# Patient Record
Sex: Female | Born: 1955 | Race: White | Hispanic: No | Marital: Married | Smoking: Never smoker
Health system: Southern US, Community
[De-identification: ages and names within clinical notes are randomized; demographics above are authoritative.]

## PROBLEM LIST (undated history)

## (undated) DIAGNOSIS — I10 Essential (primary) hypertension: Secondary | ICD-10-CM

## (undated) DIAGNOSIS — C50919 Malignant neoplasm of unspecified site of unspecified female breast: Secondary | ICD-10-CM

## (undated) DIAGNOSIS — K219 Gastro-esophageal reflux disease without esophagitis: Secondary | ICD-10-CM

## (undated) DIAGNOSIS — R7303 Prediabetes: Secondary | ICD-10-CM

## (undated) HISTORY — PX: ABDOMINAL HYSTERECTOMY: SHX81

## (undated) HISTORY — DX: Malignant neoplasm of unspecified site of unspecified female breast: C50.919

---

## 2004-10-17 ENCOUNTER — Ambulatory Visit: Payer: Self-pay | Admitting: Obstetrics and Gynecology

## 2004-11-17 ENCOUNTER — Ambulatory Visit: Payer: Self-pay | Admitting: Obstetrics and Gynecology

## 2005-12-01 ENCOUNTER — Ambulatory Visit: Payer: Self-pay | Admitting: Obstetrics and Gynecology

## 2006-12-03 ENCOUNTER — Ambulatory Visit: Payer: Self-pay | Admitting: Obstetrics and Gynecology

## 2007-10-27 ENCOUNTER — Ambulatory Visit: Payer: Self-pay | Admitting: Emergency Medicine

## 2007-12-10 ENCOUNTER — Ambulatory Visit: Payer: Self-pay | Admitting: Obstetrics and Gynecology

## 2008-01-17 ENCOUNTER — Ambulatory Visit: Payer: Self-pay | Admitting: Gastroenterology

## 2008-12-10 ENCOUNTER — Ambulatory Visit: Payer: Self-pay | Admitting: Obstetrics and Gynecology

## 2010-03-31 ENCOUNTER — Ambulatory Visit: Payer: Self-pay

## 2011-02-19 ENCOUNTER — Emergency Department: Payer: Self-pay | Admitting: Emergency Medicine

## 2011-04-04 ENCOUNTER — Ambulatory Visit: Payer: Self-pay

## 2011-05-09 ENCOUNTER — Ambulatory Visit: Payer: Self-pay

## 2011-06-20 ENCOUNTER — Ambulatory Visit: Payer: Self-pay | Admitting: Internal Medicine

## 2012-04-11 ENCOUNTER — Ambulatory Visit: Payer: Self-pay

## 2012-06-07 ENCOUNTER — Ambulatory Visit: Payer: Self-pay | Admitting: Gastroenterology

## 2012-06-21 ENCOUNTER — Ambulatory Visit: Payer: Self-pay | Admitting: Gastroenterology

## 2012-06-25 LAB — PATHOLOGY REPORT

## 2012-06-30 ENCOUNTER — Ambulatory Visit: Payer: Self-pay | Admitting: Gastroenterology

## 2013-04-17 ENCOUNTER — Ambulatory Visit: Payer: Self-pay

## 2013-04-18 ENCOUNTER — Ambulatory Visit: Payer: Self-pay

## 2013-05-05 ENCOUNTER — Other Ambulatory Visit: Payer: Self-pay | Admitting: Surgery

## 2013-05-05 DIAGNOSIS — N63 Unspecified lump in unspecified breast: Secondary | ICD-10-CM

## 2013-05-13 ENCOUNTER — Other Ambulatory Visit: Payer: Self-pay

## 2013-05-15 ENCOUNTER — Ambulatory Visit
Admission: RE | Admit: 2013-05-15 | Discharge: 2013-05-15 | Disposition: A | Discharge status: Home / Self Care | Payer: 59 | Source: Ambulatory Visit | Attending: Surgery | Admitting: Surgery

## 2013-05-15 DIAGNOSIS — N63 Unspecified lump in unspecified breast: Secondary | ICD-10-CM

## 2013-05-15 MED ORDER — GADOBENATE DIMEGLUMINE 529 MG/ML IV SOLN
20.0000 mL | Freq: Once | INTRAVENOUS | Status: AC | PRN
  Administered 2013-05-15: 20 mL via INTRAVENOUS

## 2013-11-29 ENCOUNTER — Ambulatory Visit: Payer: Self-pay | Admitting: Emergency Medicine

## 2014-04-23 ENCOUNTER — Ambulatory Visit: Payer: Self-pay

## 2014-08-11 DIAGNOSIS — E782 Mixed hyperlipidemia: Secondary | ICD-10-CM | POA: Insufficient documentation

## 2014-11-29 ENCOUNTER — Ambulatory Visit: Payer: Self-pay | Admitting: Family Medicine

## 2015-01-28 ENCOUNTER — Emergency Department
Admit: 2015-01-28 | Disposition: A | Discharge status: Home / Self Care | Payer: Self-pay | Admitting: Emergency Medicine

## 2015-01-28 LAB — CBC
HCT: 43 % (ref 35.0–47.0)
HGB: 13.8 g/dL (ref 12.0–16.0)
MCH: 25.8 pg — ABNORMAL LOW (ref 26.0–34.0)
MCHC: 32.1 g/dL (ref 32.0–36.0)
MCV: 80 fL (ref 80–100)
PLATELETS: 269 10*3/uL (ref 150–440)
RBC: 5.35 10*6/uL — AB (ref 3.80–5.20)
RDW: 14.9 % — ABNORMAL HIGH (ref 11.5–14.5)
WBC: 7.7 10*3/uL (ref 3.6–11.0)

## 2015-01-28 LAB — BASIC METABOLIC PANEL
Anion Gap: 7 (ref 7–16)
BUN: 11 mg/dL
CHLORIDE: 102 mmol/L
CREATININE: 0.82 mg/dL
Calcium, Total: 9.1 mg/dL
Co2: 26 mmol/L
EGFR (African American): >60
Glucose: 104 mg/dL — ABNORMAL HIGH
POTASSIUM: 3.3 mmol/L — AB
Sodium: 135 mmol/L

## 2015-01-28 LAB — TROPONIN I
Troponin-I: <0.03 ng/mL
Troponin-I: <0.03 ng/mL

## 2015-09-02 ENCOUNTER — Ambulatory Visit
Admission: RE | Admit: 2015-09-02 | Discharge: 2015-09-02 | Disposition: A | Discharge status: Home / Self Care | Payer: 59 | Source: Ambulatory Visit | Attending: Internal Medicine | Admitting: Internal Medicine

## 2015-09-02 ENCOUNTER — Other Ambulatory Visit: Payer: Self-pay | Admitting: Internal Medicine

## 2015-09-02 DIAGNOSIS — Z1231 Encounter for screening mammogram for malignant neoplasm of breast: Secondary | ICD-10-CM | POA: Insufficient documentation

## 2016-02-10 DIAGNOSIS — I10 Essential (primary) hypertension: Secondary | ICD-10-CM | POA: Insufficient documentation

## 2016-04-06 ENCOUNTER — Other Ambulatory Visit: Payer: Self-pay | Admitting: Obstetrics and Gynecology

## 2016-04-06 DIAGNOSIS — Z1231 Encounter for screening mammogram for malignant neoplasm of breast: Secondary | ICD-10-CM

## 2016-09-05 ENCOUNTER — Ambulatory Visit
Admission: RE | Admit: 2016-09-05 | Discharge: 2016-09-05 | Disposition: A | Discharge status: Home / Self Care | Payer: 59 | Source: Ambulatory Visit | Attending: Obstetrics and Gynecology | Admitting: Obstetrics and Gynecology

## 2016-09-05 DIAGNOSIS — R7989 Other specified abnormal findings of blood chemistry: Secondary | ICD-10-CM | POA: Insufficient documentation

## 2016-09-05 DIAGNOSIS — R0789 Other chest pain: Secondary | ICD-10-CM | POA: Insufficient documentation

## 2016-09-05 DIAGNOSIS — Z1231 Encounter for screening mammogram for malignant neoplasm of breast: Secondary | ICD-10-CM | POA: Insufficient documentation

## 2016-09-20 ENCOUNTER — Ambulatory Visit
Admission: EM | Admit: 2016-09-20 | Discharge: 2016-09-20 | Disposition: A | Discharge status: Home / Self Care | Payer: 59 | Attending: Emergency Medicine | Admitting: Emergency Medicine

## 2016-09-20 ENCOUNTER — Encounter: Payer: Self-pay | Admitting: *Deleted

## 2016-09-20 DIAGNOSIS — M5431 Sciatica, right side: Secondary | ICD-10-CM | POA: Diagnosis not present

## 2016-09-20 HISTORY — DX: Gastro-esophageal reflux disease without esophagitis: K21.9

## 2016-09-20 HISTORY — DX: Essential (primary) hypertension: I10

## 2016-09-20 MED ORDER — PREDNISONE 10 MG PO TABS: 20.0000 mg | ORAL_TABLET | Freq: Every day | ORAL | 0 refills | Status: DC

## 2016-09-20 MED ORDER — HYDROCODONE-ACETAMINOPHEN 5-325 MG PO TABS: 1.0000 | ORAL_TABLET | Freq: Four times a day (QID) | ORAL | 0 refills | Status: DC | PRN

## 2016-09-20 NOTE — ED Provider Notes (Signed)
CSN: [id number]     Arrival date & time 09/20/16  1038 History   First MD Initiated Contact with Patient 09/20/16 1245     Chief Complaint  Patient presents with  . Back Pain   (Consider location/radiation/quality/duration/timing/severity/associated sxs/prior Treatment) [name] is a well-appearing 60 y.o female, with history of HTN and GERD, presents today for acute right lower back pain onset yesterday. Patient denies injury and cannot think of anything that would trigger the pain. Patient reports that if she moves a certain way, the pain feels like "a knife is going through my back". Patient states that it "hurts my back if my take  deep breaths". She also reports trying heat patches and ibuprofen with no relief. Patient reports the pain is stabbing, shootting and sharp. Her pain is average about 3/10 if she sits still but would be over 10/10 if she moves. She denies the pain radiating down her hip. She denies previous hx of back pain. She denies history of DDD of her back or bulging/herniated disc, however patient states that she had a bone scan done 7 years ago and it showed that she may have a mild degenerative changes in her back.       Past Medical History:  Diagnosis Date  . GERD (gastroesophageal reflux disease)   . Hypertension    Past Surgical History:  Procedure Laterality Date  . ABDOMINAL HYSTERECTOMY     Family History  Problem Relation Age of Onset  . Breast cancer Neg Hx    Social History  Substance Use Topics  . Smoking status: Never Smoker  . Smokeless tobacco: Never Used  . Alcohol use Yes   OB History    No data available     Review of Systems  All other systems reviewed and are negative.   Allergies  Patient has no known allergies.  Home Medications   Prior to Admission medications   Medication Sig Start Date End Date Taking? Authorizing Provider  aspirin EC 81 MG tablet Take 81 mg by mouth daily.   Yes Historical Provider, MD  carvedilol  (COREG) 25 MG tablet Take 25 mg by mouth 2 (two) times daily with a meal.   Yes Historical Provider, MD  hydrochlorothiazide (HYDRODIURIL) 25 MG tablet Take 25 mg by mouth daily.   Yes Historical Provider, MD  omeprazole (PRILOSEC) 20 MG capsule Take 20 mg by mouth 2 (two) times daily before a meal.   Yes Historical Provider, MD  HYDROcodone-acetaminophen (NORCO/VICODIN) 5-325 MG tablet Take 1 tablet by mouth every 6 (six) hours as needed. 09/20/16   Barry Dienes, NP  predniSONE (DELTASONE) 10 MG tablet Take 2 tablets (20 mg total) by mouth daily. Take 3 tablets on day 1-2, Take 2 tablets on day 3-4, Take 1 tablet on day 5-6 09/20/16   Barry Dienes, NP   Meds Ordered and Administered this Visit  Medications - No data to display  BP (!) 129/57 (BP Location: Left Arm)   Pulse 67   Temp 98.1 F (36.7 C) (Oral)   Resp 16   Ht 5\' 6"  (1.676 m)   Wt 218 lb (98.9 kg)   SpO2 97%   BMI 35.19 kg/m  No data found.   Physical Exam  Constitutional: She is oriented to person, place, and time. She appears well-developed and well-nourished.  HENT:  Head: Normocephalic and atraumatic.  Cardiovascular: Normal rate, regular rhythm and normal heart sounds.   Pulmonary/Chest: Effort normal and breath sounds normal. No respiratory distress.  She has no wheezes.  Musculoskeletal:  Patient has difficulty getting up from the chair and up the exam table. She reports pain during ambulation. She has +straight leg raise. R hip has full ROM. Right lower back region is slightly sore on palpation.   Neurological: She is alert and oriented to person, place, and time.  Nursing note and vitals reviewed.   Urgent Care Course   Clinical Course     Procedures (including critical care time)  Labs Review Labs Reviewed - No data to display  Imaging Review No results found.  MDM   1. Sciatica of right side    Will tx with Prednisone taper and hydrocodone-apap for pain. [place] controlled substances reviewed and is  appropriate. Reviewed directions for usage and side effects. Patient states understanding and will call with questions or problems. Patient instructed to call or follow up with his/her primary care doctor if failure to improve or change in symptoms. Discharge instruction given.     Barry Dienes, NP 09/20/16 1317

## 2016-09-20 NOTE — ED Triage Notes (Signed)
Patient started having symptoms of lower back pain yesterday. Patient does have a history of lower back pain.

## 2017-03-15 DIAGNOSIS — L821 Other seborrheic keratosis: Secondary | ICD-10-CM | POA: Diagnosis not present

## 2017-03-15 DIAGNOSIS — Z86018 Personal history of other benign neoplasm: Secondary | ICD-10-CM | POA: Diagnosis not present

## 2017-03-15 DIAGNOSIS — L738 Other specified follicular disorders: Secondary | ICD-10-CM | POA: Diagnosis not present

## 2017-05-17 ENCOUNTER — Other Ambulatory Visit: Payer: Self-pay | Admitting: Obstetrics and Gynecology

## 2017-05-17 DIAGNOSIS — Z1231 Encounter for screening mammogram for malignant neoplasm of breast: Secondary | ICD-10-CM

## 2017-05-17 DIAGNOSIS — Z01419 Encounter for gynecological examination (general) (routine) without abnormal findings: Secondary | ICD-10-CM | POA: Diagnosis not present

## 2017-05-17 DIAGNOSIS — Z1211 Encounter for screening for malignant neoplasm of colon: Secondary | ICD-10-CM | POA: Diagnosis not present

## 2017-08-13 DIAGNOSIS — Z86010 Personal history of colonic polyps: Secondary | ICD-10-CM | POA: Diagnosis not present

## 2017-09-04 DIAGNOSIS — Z0000 Encounter for general adult medical examination without abnormal findings: Secondary | ICD-10-CM | POA: Diagnosis not present

## 2017-09-10 ENCOUNTER — Ambulatory Visit
Admission: RE | Admit: 2017-09-10 | Discharge: 2017-09-10 | Disposition: A | Discharge status: Home / Self Care | Payer: 59 | Source: Ambulatory Visit | Attending: Obstetrics and Gynecology | Admitting: Obstetrics and Gynecology

## 2017-09-10 DIAGNOSIS — Z1231 Encounter for screening mammogram for malignant neoplasm of breast: Secondary | ICD-10-CM | POA: Diagnosis not present

## 2017-09-11 DIAGNOSIS — M5010 Cervical disc disorder with radiculopathy, unspecified cervical region: Secondary | ICD-10-CM | POA: Diagnosis not present

## 2017-09-11 DIAGNOSIS — Z0000 Encounter for general adult medical examination without abnormal findings: Secondary | ICD-10-CM | POA: Diagnosis not present

## 2017-09-11 DIAGNOSIS — Z789 Other specified health status: Secondary | ICD-10-CM | POA: Insufficient documentation

## 2017-10-03 DIAGNOSIS — M5010 Cervical disc disorder with radiculopathy, unspecified cervical region: Secondary | ICD-10-CM | POA: Diagnosis not present

## 2017-12-12 ENCOUNTER — Encounter: Payer: Self-pay | Admitting: *Deleted

## 2017-12-13 ENCOUNTER — Ambulatory Visit: Payer: 59 | Admitting: Certified Registered Nurse Anesthetist

## 2017-12-13 ENCOUNTER — Ambulatory Visit
Admission: RE | Admit: 2017-12-13 | Discharge: 2017-12-13 | Disposition: A | Discharge status: Home / Self Care | Payer: 59 | Source: Ambulatory Visit | Attending: Gastroenterology | Admitting: Gastroenterology

## 2017-12-13 ENCOUNTER — Encounter: Payer: Self-pay | Admitting: *Deleted

## 2017-12-13 ENCOUNTER — Encounter
Admission: RE | Disposition: A | Discharge status: Home / Self Care | Payer: Self-pay | Source: Ambulatory Visit | Attending: Gastroenterology

## 2017-12-13 DIAGNOSIS — Z7982 Long term (current) use of aspirin: Secondary | ICD-10-CM | POA: Insufficient documentation

## 2017-12-13 DIAGNOSIS — K635 Polyp of colon: Secondary | ICD-10-CM | POA: Diagnosis not present

## 2017-12-13 DIAGNOSIS — K219 Gastro-esophageal reflux disease without esophagitis: Secondary | ICD-10-CM | POA: Insufficient documentation

## 2017-12-13 DIAGNOSIS — I10 Essential (primary) hypertension: Secondary | ICD-10-CM | POA: Insufficient documentation

## 2017-12-13 DIAGNOSIS — Z86010 Personal history of colonic polyps: Secondary | ICD-10-CM | POA: Diagnosis not present

## 2017-12-13 DIAGNOSIS — Z1211 Encounter for screening for malignant neoplasm of colon: Secondary | ICD-10-CM | POA: Insufficient documentation

## 2017-12-13 DIAGNOSIS — D120 Benign neoplasm of cecum: Secondary | ICD-10-CM | POA: Insufficient documentation

## 2017-12-13 DIAGNOSIS — K5730 Diverticulosis of large intestine without perforation or abscess without bleeding: Secondary | ICD-10-CM | POA: Insufficient documentation

## 2017-12-13 DIAGNOSIS — Z79899 Other long term (current) drug therapy: Secondary | ICD-10-CM | POA: Diagnosis not present

## 2017-12-13 DIAGNOSIS — D124 Benign neoplasm of descending colon: Secondary | ICD-10-CM | POA: Diagnosis not present

## 2017-12-13 DIAGNOSIS — K6389 Other specified diseases of intestine: Secondary | ICD-10-CM | POA: Diagnosis not present

## 2017-12-13 DIAGNOSIS — D123 Benign neoplasm of transverse colon: Secondary | ICD-10-CM | POA: Diagnosis not present

## 2017-12-13 DIAGNOSIS — K621 Rectal polyp: Secondary | ICD-10-CM | POA: Insufficient documentation

## 2017-12-13 HISTORY — PX: COLONOSCOPY WITH PROPOFOL: SHX5780

## 2017-12-13 SURGERY — COLONOSCOPY WITH PROPOFOL
Anesthesia: General

## 2017-12-13 MED ORDER — PROPOFOL 500 MG/50ML IV EMUL
INTRAVENOUS | Status: AC
  Filled 2017-12-13: qty 50

## 2017-12-13 MED ORDER — SODIUM CHLORIDE 0.9 % IV SOLN
INTRAVENOUS | Status: DC
  Administered 2017-12-13: 12:00:00 via INTRAVENOUS
  Administered 2017-12-13: 1000 mL via INTRAVENOUS

## 2017-12-13 MED ORDER — PROPOFOL 500 MG/50ML IV EMUL
INTRAVENOUS | Status: DC | PRN
  Administered 2017-12-13: 140 ug/kg/min via INTRAVENOUS

## 2017-12-13 MED ORDER — PROPOFOL 10 MG/ML IV BOLUS
INTRAVENOUS | Status: AC
  Filled 2017-12-13: qty 20

## 2017-12-13 MED ORDER — PROPOFOL 10 MG/ML IV BOLUS
INTRAVENOUS | Status: DC | PRN
  Administered 2017-12-13: 60 mg via INTRAVENOUS

## 2017-12-13 MED ORDER — SODIUM CHLORIDE 0.9 % IV SOLN: INTRAVENOUS | Status: DC

## 2017-12-13 NOTE — H&P (Signed)
Outpatient short stay form Pre-procedure 12/13/2017 11:02 AM Lollie Sails MD  Primary Physician: Dr. Emily Filbert  Reason for visit: Colonoscopy  History of present illness: Patient is a 62 year old female presenting today as above.  She has personal history of adenomatous colon polyps with her last colonoscopy being done 06/21/2012.  She tolerated her prep well.  She takes no blood thinners or aspirin products with the exception of 81 mg aspirin.    Current Facility-Administered Medications:  .  0.9 %  sodium chloride infusion, , Intravenous, Continuous, Lollie Sails, MD, Last Rate: 700 mL/hr at 12/13/17 1043 .  0.9 %  sodium chloride infusion, , Intravenous, Continuous, Lollie Sails, MD  Medications Prior to Admission  Medication Sig Dispense Refill Last Dose  . aspirin EC 81 MG tablet Take 81 mg by mouth daily.   Past Week at Unknown time  . carvedilol (COREG) 25 MG tablet Take 25 mg by mouth 2 (two) times daily with a meal.   12/13/2017 at Unknown time  . hydrochlorothiazide (HYDRODIURIL) 25 MG tablet Take 25 mg by mouth daily.   12/13/2017 at Unknown time  . HYDROcodone-acetaminophen (NORCO/VICODIN) 5-325 MG tablet Take 1 tablet by mouth every 6 (six) hours as needed. 12 tablet 0 Past Week at Unknown time  . omeprazole (PRILOSEC) 20 MG capsule Take 20 mg by mouth 2 (two) times daily before a meal.   12/13/2017 at Unknown time  . predniSONE (DELTASONE) 10 MG tablet Take 2 tablets (20 mg total) by mouth daily. Take 3 tablets on day 1-2, Take 2 tablets on day 3-4, Take 1 tablet on day 5-6 (Patient not taking: Reported on 12/13/2017) 12 tablet 0 Completed Course at Unknown time     Not on File   Past Medical History:  Diagnosis Date  . GERD (gastroesophageal reflux disease)   . Hypertension     Review of systems:      Physical Exam    Heart and lungs: Regular rate and rhythm without rub or gallop, lungs are bilaterally clear.    HEENT: Normocephalic atraumatic  eyes are anicteric    Other:    Pertinant exam for procedure: Soft nontender nondistended bowel sounds positive normoactive    Planned proceedures: Colonoscopy and indicated procedures. I have discussed the risks benefits and complications of procedures to include not limited to bleeding, infection, perforation and the risk of sedation and the patient wishes to proceed.    Lollie Sails, MD Gastroenterology 12/13/2017  11:02 AM

## 2017-12-13 NOTE — Anesthesia Post-op Follow-up Note (Signed)
Anesthesia QCDR form completed.        

## 2017-12-13 NOTE — Op Note (Signed)
[facility] Gastroenterology Patient Name: Kristine Rose Procedure Date: 12/13/2017 10:23 AM MRN: [id number] Account #: [id number] Date of Birth: January 18, 1956 Admit Type: Outpatient Age: 62 Room: [facility] ENDO ROOM 1 Gender: Female Note Status: Finalized Procedure:            Colonoscopy Indications:          Personal history of colonic polyps Providers:            Lollie Sails, MD Referring MD:         Rusty Aus, MD (Referring MD) Medicines:            Monitored Anesthesia Care Complications:        No immediate complications. Procedure:            Pre-Anesthesia Assessment:                       - ASA Grade Assessment: II - A patient with mild                        systemic disease.                       After obtaining informed consent, the colonoscope was                        passed under direct vision. Throughout the procedure,                        the patient's blood pressure, pulse, and oxygen                        saturations were monitored continuously. The                        Colonoscope was introduced through the anus and                        advanced to the the cecum, identified by appendiceal                        orifice and ileocecal valve. The quality of the bowel                        preparation was good. Findings:      Multiple small-mouthed diverticula were found in the sigmoid colon and       descending colon.      A 14 mm polyp was found in the cecum. The polyp was sessile. The polyp       was removed with a cold snare. Resection and retrieval were complete.      A 3 mm polyp was found in the splenic flexure. The polyp was sessile.       The polyp was removed with a cold biopsy forceps. Resection and       retrieval were complete.      A 3 mm polyp was found in the descending colon. The polyp was sessile.       The polyp was removed with a cold biopsy forceps. Resection and       retrieval were complete.      A 3 mm  polyp was found in the sigmoid colon. The polyp  was sessile. The       polyp was removed with a cold biopsy forceps. Resection and retrieval       were complete.      Four sessile polyps were found in the rectum. The polyps were 1 to 2 mm       in size. These polyps were removed with a cold biopsy forceps. Resection       and retrieval were complete.      The digital rectal exam was normal. Impression:           - Diverticulosis in the sigmoid colon and in the                        descending colon.                       - One 14 mm polyp in the cecum, removed with a cold                        snare. Resected and retrieved.                       - One 3 mm polyp at the splenic flexure, removed with a                        cold biopsy forceps. Resected and retrieved.                       - One 3 mm polyp in the descending colon, removed with                        a cold biopsy forceps. Resected and retrieved.                       - One 3 mm polyp in the sigmoid colon, removed with a                        cold biopsy forceps. Resected and retrieved.                       - Four 1 to 2 mm polyps in the rectum, removed with a                        cold biopsy forceps. Resected and retrieved. Recommendation:       - Full liquid diet today, then advance as tolerated to                        soft diet for 2 days. Procedure Code(s):    --- Professional ---                       [phone], Colonoscopy, flexible; with removal of tumor(s),                        polyp(s), or other lesion(s) by snare technique                       86761, 59, Colonoscopy, flexible; with biopsy, single  or multiple Diagnosis Code(s):    --- Professional ---                       D12.0, Benign neoplasm of cecum                       D12.3, Benign neoplasm of transverse colon (hepatic                        flexure or splenic flexure)                       D12.4, Benign neoplasm of  descending colon                       D12.5, Benign neoplasm of sigmoid colon                       K62.1, Rectal polyp                       Z86.010, Personal history of colonic polyps                       K57.30, Diverticulosis of large intestine without                        perforation or abscess without bleeding CPT copyright 2016 American Medical Association. All rights reserved. The codes documented in this report are preliminary and upon coder review may  be revised to meet current compliance requirements. Lollie Sails, MD 12/13/2017 12:09:00 PM This report has been signed electronically. Number of Addenda: 0 Note Initiated On: 12/13/2017 10:23 AM Scope Withdrawal Time: 0 hours 31 minutes 50 seconds  Total Procedure Duration: 0 hours 56 minutes 35 seconds       [facility]

## 2017-12-13 NOTE — Transfer of Care (Signed)
Immediate Anesthesia Transfer of Care Note  Patient: Kristine Rose  Procedure(s) Performed: COLONOSCOPY WITH PROPOFOL (N/A )  Patient Location: PACU  Anesthesia Type:General  Level of Consciousness: sedated  Airway & Oxygen Therapy: Patient Spontanous Breathing and Patient connected to nasal cannula oxygen  Post-op Assessment: Report given to RN and Post -op Vital signs reviewed and stable  Post vital signs: Reviewed and stable  Last Vitals:  Vitals:   12/13/17 0918 12/13/17 1213  BP: 124/64 109/63  Pulse: 66   Resp: 20 16  Temp: (!) 36.1 C (!) 36.4 C  SpO2: 98%     Last Pain:  Vitals:   12/13/17 1213  TempSrc: Tympanic      Patients Stated Pain Goal: 0 (58/83/25 4982)  Complications: No apparent anesthesia complications

## 2017-12-13 NOTE — Anesthesia Preprocedure Evaluation (Signed)
Anesthesia Evaluation  Patient identified by MRN, date of birth, ID band Patient awake    Reviewed: Allergy & Precautions, H&P , NPO status , reviewed documented beta blocker date and time   Airway Mallampati: II  TM Distance: >3 FB     Dental  (+) Teeth Intact   Pulmonary    Pulmonary exam normal        Cardiovascular hypertension, On Medications Normal cardiovascular exam     Neuro/Psych    GI/Hepatic GERD  Controlled,  Endo/Other    Renal/GU      Musculoskeletal   Abdominal   Peds  Hematology   Anesthesia Other Findings   Reproductive/Obstetrics                             Anesthesia Physical Anesthesia Plan  ASA: II  Anesthesia Plan: General   Post-op Pain Management:    Induction:   PONV Risk Score and Plan: 3 and Propofol infusion  Airway Management Planned:   Additional Equipment:   Intra-op Plan:   Post-operative Plan:   Informed Consent: I have reviewed the patients History and Physical, chart, labs and discussed the procedure including the risks, benefits and alternatives for the proposed anesthesia with the patient or authorized representative who has indicated his/her understanding and acceptance.   Dental Advisory Given  Plan Discussed with: CRNA  Anesthesia Plan Comments:         Anesthesia Quick Evaluation

## 2017-12-13 NOTE — Anesthesia Procedure Notes (Signed)
Performed by: Amarii Bordas, CRNA Pre-anesthesia Checklist: Patient identified, Emergency Drugs available, Suction available, Patient being monitored and Timeout performed Oxygen Delivery Method: Nasal cannula Induction Type: IV induction       

## 2017-12-13 NOTE — Anesthesia Postprocedure Evaluation (Signed)
Anesthesia Post Note  Patient: Kristine Rose  Procedure(s) Performed: COLONOSCOPY WITH PROPOFOL (N/A )  Patient location during evaluation: Endoscopy Anesthesia Type: General Level of consciousness: awake and alert Pain management: pain level controlled Vital Signs Assessment: post-procedure vital signs reviewed and stable Respiratory status: spontaneous breathing, nonlabored ventilation, respiratory function stable and patient connected to nasal cannula oxygen Cardiovascular status: blood pressure returned to baseline and stable Postop Assessment: no apparent nausea or vomiting Anesthetic complications: no     Last Vitals:  Vitals:   12/13/17 0918 12/13/17 1213  BP: 124/64 109/63  Pulse: 66   Resp: 20 16  Temp: (!) 36.1 C (!) 36.4 C  SpO2: 98%     Last Pain:  Vitals:   12/13/17 1213  TempSrc: Tympanic                 Feliz Herard Harvie Heck

## 2017-12-18 LAB — SURGICAL PATHOLOGY

## 2018-05-23 DIAGNOSIS — Z1231 Encounter for screening mammogram for malignant neoplasm of breast: Secondary | ICD-10-CM | POA: Diagnosis not present

## 2018-05-23 DIAGNOSIS — Z01419 Encounter for gynecological examination (general) (routine) without abnormal findings: Secondary | ICD-10-CM | POA: Diagnosis not present

## 2018-07-22 ENCOUNTER — Other Ambulatory Visit: Payer: Self-pay | Admitting: Obstetrics and Gynecology

## 2018-07-22 DIAGNOSIS — Z1231 Encounter for screening mammogram for malignant neoplasm of breast: Secondary | ICD-10-CM

## 2018-09-05 DIAGNOSIS — E538 Deficiency of other specified B group vitamins: Secondary | ICD-10-CM | POA: Diagnosis not present

## 2018-09-05 DIAGNOSIS — M5010 Cervical disc disorder with radiculopathy, unspecified cervical region: Secondary | ICD-10-CM | POA: Diagnosis not present

## 2018-09-05 DIAGNOSIS — Z0000 Encounter for general adult medical examination without abnormal findings: Secondary | ICD-10-CM | POA: Diagnosis not present

## 2018-09-12 DIAGNOSIS — Z0000 Encounter for general adult medical examination without abnormal findings: Secondary | ICD-10-CM | POA: Diagnosis not present

## 2018-09-12 DIAGNOSIS — G5701 Lesion of sciatic nerve, right lower limb: Secondary | ICD-10-CM | POA: Diagnosis not present

## 2018-09-16 ENCOUNTER — Ambulatory Visit
Admission: RE | Admit: 2018-09-16 | Discharge: 2018-09-16 | Disposition: A | Discharge status: Home / Self Care | Payer: 59 | Source: Ambulatory Visit | Attending: Obstetrics and Gynecology | Admitting: Obstetrics and Gynecology

## 2018-09-16 DIAGNOSIS — Z1231 Encounter for screening mammogram for malignant neoplasm of breast: Secondary | ICD-10-CM | POA: Diagnosis present

## 2018-12-16 DIAGNOSIS — J40 Bronchitis, not specified as acute or chronic: Secondary | ICD-10-CM | POA: Diagnosis not present

## 2018-12-16 DIAGNOSIS — J45902 Unspecified asthma with status asthmaticus: Secondary | ICD-10-CM | POA: Diagnosis not present

## 2019-06-14 IMAGING — MG 2D DIGITAL SCREENING BILATERAL MAMMOGRAM WITH CAD AND ADJUNCT TO
8 of 12 series · 8 of 28 positions shown · non-contrast
Comparison: Previous exam(s).

CLINICAL DATA: Screening.

EXAM:
2D DIGITAL SCREENING BILATERAL MAMMOGRAM WITH CAD AND ADJUNCT TOMO

[L MLO synth-2D]
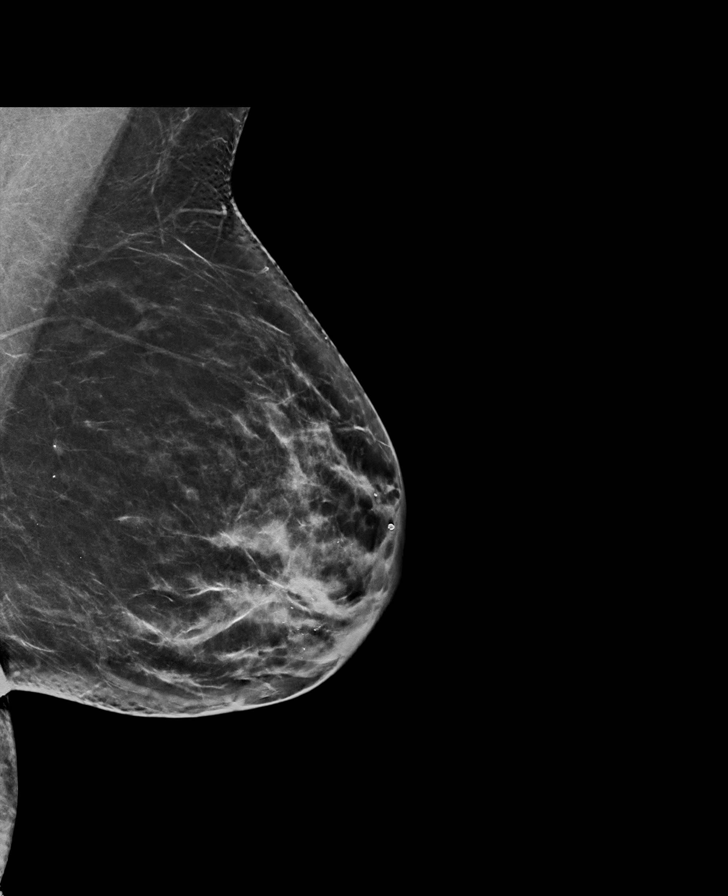

[L MLO]
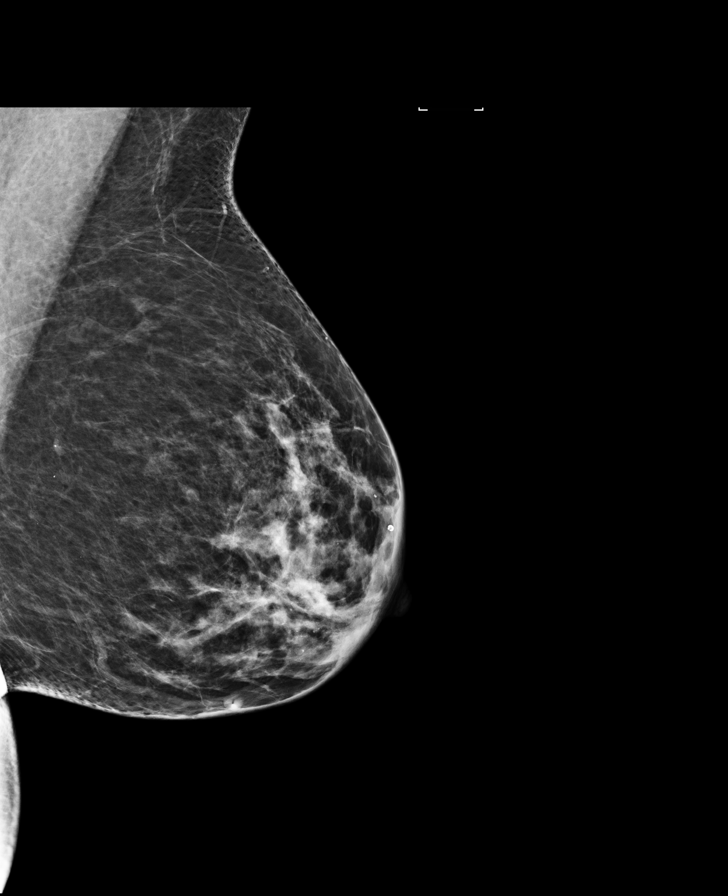

[R CC synth-2D]
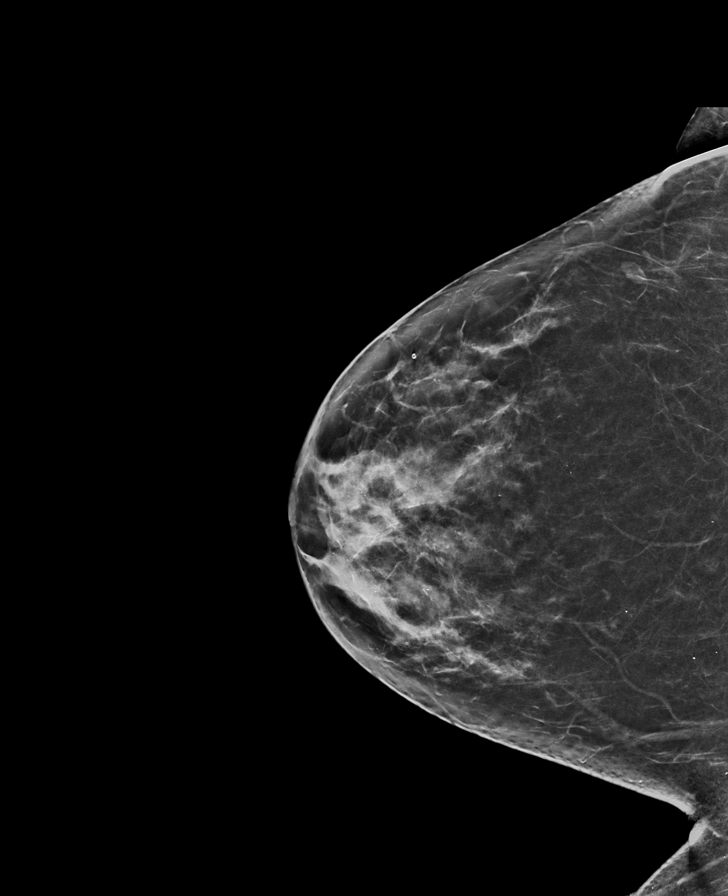

[R MLO]
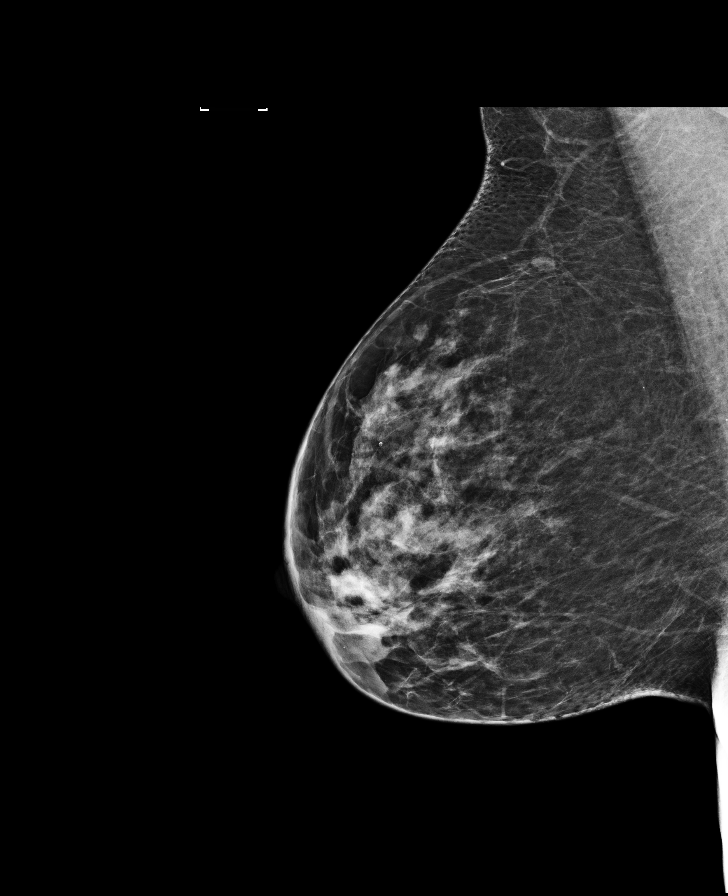

[R MLO synth-2D]
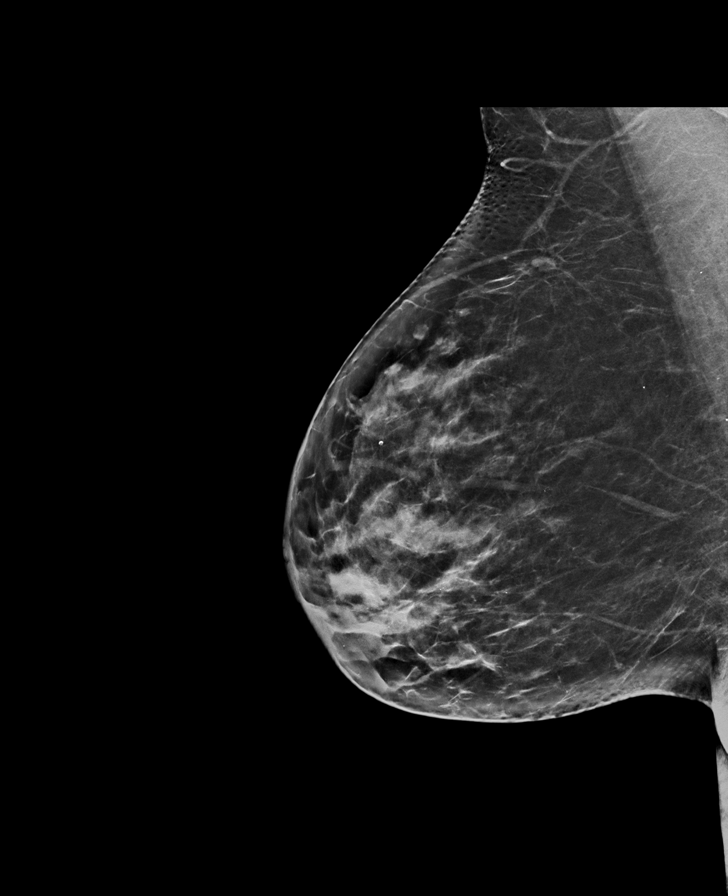

[L CC synth-2D]
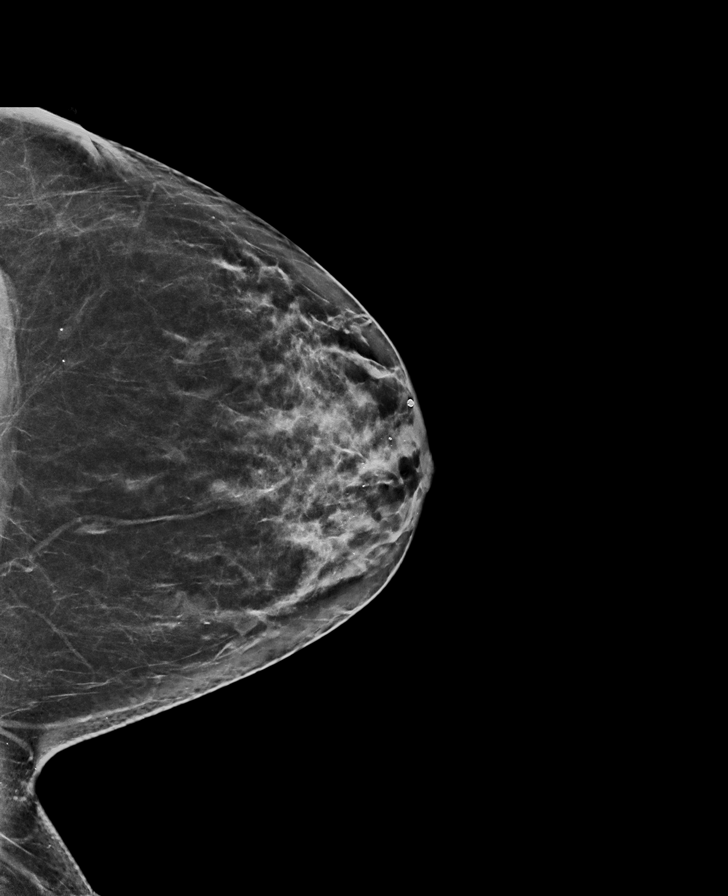

[R CC]
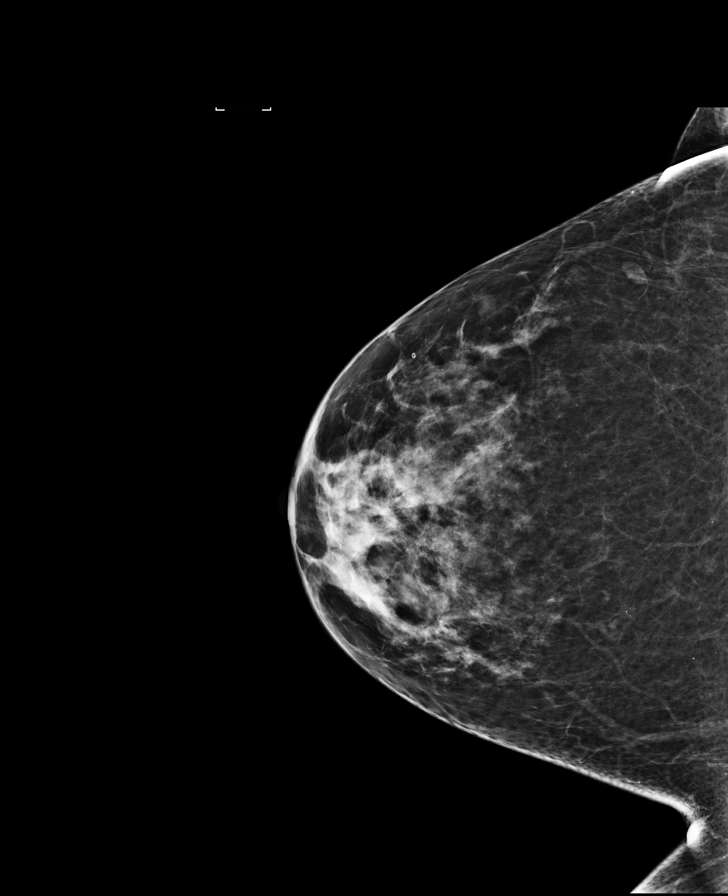

[L CC]
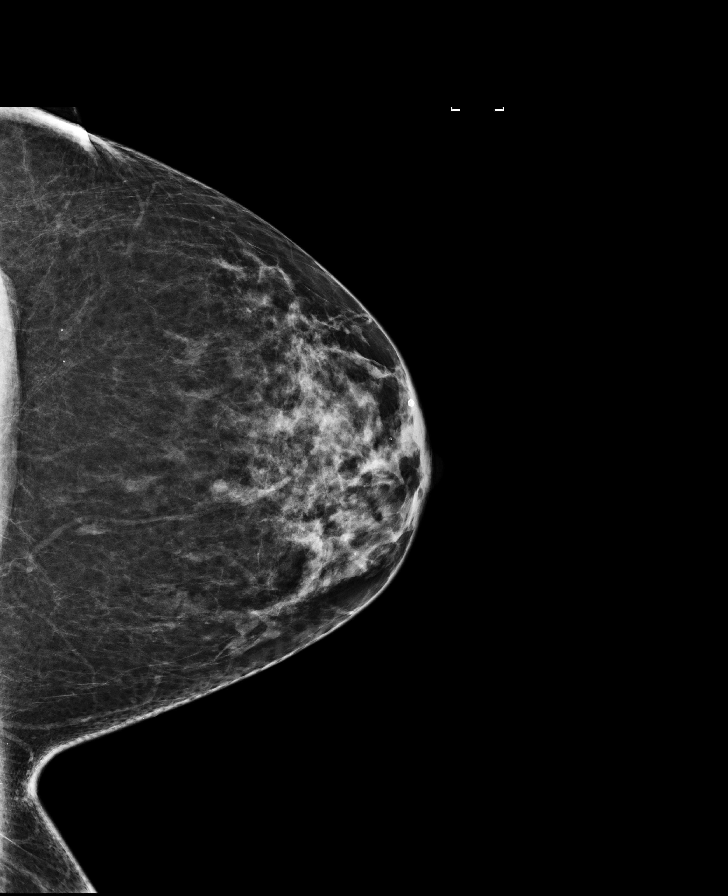

[8 of 28 positions shown; findings below may reference images not displayed]

ACR Breast Density Category c: The breast tissue is heterogeneously
dense, which may obscure small masses.
FINDINGS: There are no findings suspicious for malignancy. Images were
processed with CAD.
IMPRESSION: No mammographic evidence of malignancy. A result letter of this
screening mammogram will be mailed directly to the patient.

RECOMMENDATION:
Screening mammogram in one year. (Code:[id number])

BI-RADS CATEGORY  1: Negative.

## 2019-09-17 ENCOUNTER — Other Ambulatory Visit: Payer: Self-pay | Admitting: Obstetrics and Gynecology

## 2019-09-17 DIAGNOSIS — Z1231 Encounter for screening mammogram for malignant neoplasm of breast: Secondary | ICD-10-CM

## 2019-09-23 ENCOUNTER — Ambulatory Visit
Admission: RE | Admit: 2019-09-23 | Discharge: 2019-09-23 | Disposition: A | Discharge status: Home / Self Care | Payer: 59 | Source: Ambulatory Visit | Attending: Obstetrics and Gynecology | Admitting: Obstetrics and Gynecology

## 2019-09-23 DIAGNOSIS — Z1231 Encounter for screening mammogram for malignant neoplasm of breast: Secondary | ICD-10-CM | POA: Diagnosis present

## 2021-11-18 DIAGNOSIS — D126 Benign neoplasm of colon, unspecified: Secondary | ICD-10-CM | POA: Insufficient documentation

## 2022-02-02 ENCOUNTER — Other Ambulatory Visit: Payer: Self-pay | Admitting: Internal Medicine

## 2022-02-02 DIAGNOSIS — Z1231 Encounter for screening mammogram for malignant neoplasm of breast: Secondary | ICD-10-CM

## 2022-03-10 ENCOUNTER — Ambulatory Visit
Admission: RE | Admit: 2022-03-10 | Discharge: 2022-03-10 | Disposition: A | Discharge status: Home / Self Care | Payer: BC Managed Care – PPO | Source: Ambulatory Visit | Attending: Internal Medicine | Admitting: Internal Medicine

## 2022-03-10 DIAGNOSIS — Z1231 Encounter for screening mammogram for malignant neoplasm of breast: Secondary | ICD-10-CM | POA: Insufficient documentation

## 2022-03-16 ENCOUNTER — Other Ambulatory Visit: Payer: Self-pay | Admitting: Internal Medicine

## 2022-03-16 DIAGNOSIS — R921 Mammographic calcification found on diagnostic imaging of breast: Secondary | ICD-10-CM

## 2022-03-16 DIAGNOSIS — R928 Other abnormal and inconclusive findings on diagnostic imaging of breast: Secondary | ICD-10-CM

## 2022-03-30 ENCOUNTER — Ambulatory Visit
Admission: RE | Admit: 2022-03-30 | Discharge: 2022-03-30 | Disposition: A | Discharge status: Home / Self Care | Payer: BC Managed Care – PPO | Source: Ambulatory Visit | Attending: Internal Medicine | Admitting: Internal Medicine

## 2022-03-30 DIAGNOSIS — R921 Mammographic calcification found on diagnostic imaging of breast: Secondary | ICD-10-CM | POA: Insufficient documentation

## 2022-03-30 DIAGNOSIS — R928 Other abnormal and inconclusive findings on diagnostic imaging of breast: Secondary | ICD-10-CM

## 2022-04-13 ENCOUNTER — Other Ambulatory Visit: Payer: Self-pay | Admitting: Internal Medicine

## 2022-04-13 DIAGNOSIS — R928 Other abnormal and inconclusive findings on diagnostic imaging of breast: Secondary | ICD-10-CM

## 2022-04-13 DIAGNOSIS — R921 Mammographic calcification found on diagnostic imaging of breast: Secondary | ICD-10-CM

## 2022-04-19 ENCOUNTER — Ambulatory Visit
Admission: RE | Admit: 2022-04-19 | Discharge: 2022-04-19 | Disposition: A | Discharge status: Home / Self Care | Payer: BC Managed Care – PPO | Source: Ambulatory Visit | Attending: Internal Medicine | Admitting: Internal Medicine

## 2022-04-19 DIAGNOSIS — R928 Other abnormal and inconclusive findings on diagnostic imaging of breast: Secondary | ICD-10-CM | POA: Diagnosis present

## 2022-04-19 DIAGNOSIS — R921 Mammographic calcification found on diagnostic imaging of breast: Secondary | ICD-10-CM | POA: Diagnosis present

## 2022-04-21 ENCOUNTER — Encounter: Payer: Self-pay | Admitting: *Deleted

## 2022-04-21 DIAGNOSIS — D0512 Intraductal carcinoma in situ of left breast: Secondary | ICD-10-CM

## 2022-04-21 LAB — SURGICAL PATHOLOGY

## 2022-04-24 ENCOUNTER — Encounter: Payer: Self-pay | Admitting: *Deleted

## 2022-04-26 ENCOUNTER — Encounter: Payer: Self-pay | Admitting: Oncology

## 2022-04-26 ENCOUNTER — Encounter: Payer: Self-pay | Admitting: *Deleted

## 2022-04-26 ENCOUNTER — Inpatient Hospital Stay: Payer: BC Managed Care – PPO | Attending: Oncology | Admitting: Oncology

## 2022-04-26 ENCOUNTER — Inpatient Hospital Stay: Payer: BC Managed Care – PPO

## 2022-04-26 VITALS — BP 121/80 | HR 60 | Temp 98.1°F | Resp 16 | Ht 67.0 in | Wt 214.0 lb

## 2022-04-26 DIAGNOSIS — Z170 Estrogen receptor positive status [ER+]: Secondary | ICD-10-CM

## 2022-04-26 DIAGNOSIS — Z79899 Other long term (current) drug therapy: Secondary | ICD-10-CM | POA: Diagnosis not present

## 2022-04-26 DIAGNOSIS — D0512 Intraductal carcinoma in situ of left breast: Secondary | ICD-10-CM | POA: Insufficient documentation

## 2022-04-26 DIAGNOSIS — I10 Essential (primary) hypertension: Secondary | ICD-10-CM | POA: Diagnosis not present

## 2022-04-26 DIAGNOSIS — Z7189 Other specified counseling: Secondary | ICD-10-CM

## 2022-04-26 NOTE — Research (Signed)
MTG-015 - Tissue and Bodily Fluids: Translational Medicine: Discovery and Evaluation of Biomarkers/Pharmacogenomics for the Diagnosis and Personalized Management of Patients     Dr. Janese Banks reviewed the [id number] protocol with the patient. Patient was provided a copy of the MTG-015 protocol consent / Hipaa to review at home prior to her next visit. If the patient is interested we will proceed with consent and labs with her next scheduled appointment in the cancer center.   Jeral Fruit, RN 04/26/22 1:53 PM

## 2022-04-26 NOTE — Progress Notes (Signed)
Hematology/Oncology Consult note [facility] [phone] [phone]  Patient Care Team: Rusty Aus, MD as PCP - General (Internal Medicine) Daiva Huge, RN as Oncology Nurse Navigator   Name of the patient: Kristine Rose  [id number]  03-Nov-1955    Reason for referral-new diagnosis of breast cancer   Referring physician-Dr. Emily Filbert  Date of visit: 04/26/22   History of presenting illness- Patient is a 66 year old female who underwent a screening bilateral mammogram in May 2023 which was followed by diagnostic mammogram.  Picked up a 2 cm group of amorphous calcifications in the left upper outer quadrant of the breast for which biopsy was recommended.  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  ER more than 90% positive.  She has been referred for further management  No family history of breast cancer.  Father had prostate cancer.  ECOG PS- 0  Pain scale- 0   Review of systems- Review of Systems  Constitutional:  Negative for chills, fever, malaise/fatigue and weight loss.  HENT:  Negative for congestion, ear discharge and nosebleeds.   Eyes:  Negative for blurred vision.  Respiratory:  Negative for cough, hemoptysis, sputum production, shortness of breath and wheezing.   Cardiovascular:  Negative for chest pain, palpitations, orthopnea and claudication.  Gastrointestinal:  Negative for abdominal pain, blood in stool, constipation, diarrhea, heartburn, melena, nausea and vomiting.  Genitourinary:  Negative for dysuria, flank pain, frequency, hematuria and urgency.  Musculoskeletal:  Negative for back pain, joint pain and myalgias.  Skin:  Negative for rash.  Neurological:  Negative for dizziness, tingling, focal weakness, seizures, weakness and headaches.  Endo/Heme/Allergies:  Does not bruise/bleed easily.  Psychiatric/Behavioral:  Negative for depression and suicidal ideas. The patient does not have  insomnia.     Allergies  Allergen Reactions   Latex Hives    Redness, itching    There are no problems to display for this patient.    Past Medical History:  Diagnosis Date   GERD (gastroesophageal reflux disease)    Hypertension      Past Surgical History:  Procedure Laterality Date   ABDOMINAL HYSTERECTOMY     COLONOSCOPY WITH PROPOFOL N/A 12/13/2017   Procedure: COLONOSCOPY WITH PROPOFOL;  Surgeon: Lollie Sails, MD;  Location: [facility];  Service: Endoscopy;  Laterality: N/A;    Social History   Socioeconomic History   Marital status: Married    Spouse name: Not on file   Number of children: Not on file   Years of education: Not on file   Highest education level: Not on file  Occupational History   Not on file  Tobacco Use   Smoking status: Never   Smokeless tobacco: Never  Vaping Use   Vaping Use: Never used  Substance and Sexual Activity   Alcohol use: Yes    Alcohol/week: 1.0 standard drink of alcohol    Types: 1 Glasses of wine per week   Drug use: No   Sexual activity: Yes  Other Topics Concern   Not on file  Social History Narrative   Not on file   Social Determinants of Health   Financial Resource Strain: Not on file  Food Insecurity: Not on file  Transportation Needs: Not on file  Physical Activity: Not on file  Stress: Not on file  Social Connections: Not on file  Intimate Partner Violence: Not on file     Family History  Problem Relation Age of Onset  Stroke Mother    Heart disease Mother    Prostate cancer Father    Heart disease Father    Testicular cancer Son    Breast cancer Neg Hx      Current Outpatient Medications:    carvedilol (COREG) 25 MG tablet, Take 25 mg by mouth 2 (two) times daily with a meal., Disp: , Rfl:    hydrochlorothiazide (HYDRODIURIL) 25 MG tablet, Take 25 mg by mouth daily., Disp: , Rfl:    aspirin EC 81 MG tablet, Take 81 mg by mouth daily. (Patient not taking: Reported on 04/26/2022),  Disp: , Rfl:    Physical exam:  Vitals:   04/26/22 1339  BP: 121/80  Pulse: 60  Resp: 16  Temp: 98.1 F (36.7 C)  TempSrc: Tympanic  SpO2: 96%  Weight: 214 lb (97.1 kg)  Height: '5\' 7"'$  (1.702 m)   Physical Exam Constitutional:      General: She is not in acute distress. Cardiovascular:     Rate and Rhythm: Normal rate and regular rhythm.     Heart sounds: Normal heart sounds.  Pulmonary:     Effort: Pulmonary effort is normal.     Breath sounds: Normal breath sounds.  Abdominal:     General: Bowel sounds are normal.     Palpations: Abdomen is soft.  Skin:    General: Skin is warm and dry.  Neurological:     Mental Status: She is alert and oriented to person, place, and time.   Breast exam: No palpable masses in either breast.  No palpable bilateral axillary adenopathy.       Latest Ref Rng & Units 01/28/2015    1:52 PM  CMP  Glucose mg/dL 104   BUN mg/dL 11   Creatinine mg/dL 0.82   Sodium mmol/L 135   Potassium mmol/L 3.3   Chloride mmol/L 102   CO2 mmol/L 26   Calcium mg/dL 9.1       Latest Ref Rng & Units 01/28/2015    1:52 PM  CBC  WBC 3.6 - 11.0 x10 3/mm 3 7.7   Hemoglobin 12.0 - 16.0 g/dL 13.8   Hematocrit 35.0 - 47.0 % 43.0   Platelets 150 - 440 x10 3/mm 3 269     No images are attached to the encounter.  MM LT BREAST BX W LOC DEV 1ST LESION IMAGE BX SPEC STEREO GUIDE  Addendum Date: 04/20/2022   ADDENDUM REPORT: 04/20/2022 15:45 ADDENDUM: Pathology revealed: BREAST, LEFT, UPPER OUTER; STEREOTACTIC-GUIDED CORE BIOPSY (X clip): DUCTAL CARCINOMA IN SITU (DCIS), LOW GRADE, ASSOCIATED WITH CALCIFICATIONS. This was found to be concordant by Dr. Valentino Saxon. Pathology results were discussed with the patient by telephone. The patient reported doing well after the biopsy with tenderness at the site. Post biopsy instructions and care were reviewed (including resolution of hematoma) and questions were answered. The patient was encouraged to call  [facility] for any additional concerns. My direct number was provided. A surgical and medical oncologist referral will be arranged by Casper Harrison RN, Oncology Nurse Navigator of [facility] of [facility]. Pretreatment breast MR could be considered. Pathology results reported by Stacie Acres RN on 04/20/2022. Electronically Signed   By: Valentino Saxon M.D.   On: 04/20/2022 15:45   Result Date: 04/20/2022 CLINICAL DATA:  Indeterminate LEFT breast calcifications EXAM: LEFT BREAST STEREOTACTIC CORE NEEDLE BIOPSY COMPARISON:  Previous exams. FINDINGS: The patient and I discussed the procedure of stereotactic-guided  biopsy including benefits and alternatives. We discussed the high likelihood of a successful procedure. We discussed the risks of the procedure including infection, bleeding, tissue injury, clip migration, and inadequate sampling. Informed written consent was given. The usual time out protocol was performed immediately prior to the procedure. Using sterile technique and 1% lidocaine and 1% lidocaine with epinephrine as local anesthetic, under stereotactic guidance, a 9 gauge vacuum assisted device was used to perform core needle biopsy of calcifications in the upper outer quadrant of the left breast using a superior approach. Specimen radiograph was performed showing representative calcifications. Specimens with calcifications are identified for pathology. Lesion quadrant: Upper outer quadrant At the conclusion of the procedure, an X shaped tissue marker clip was deployed into the biopsy cavity. A hematoma was noted on initial post clip marker mammogram. Firm manual pressure was immediately applied. Follow-up 2-view mammogram was performed and dictated separately. IMPRESSION: 1.  Stereotactic-guided biopsy of indeterminate calcifications. 2. Biopsy was complicated by a post biopsy hematoma. Firm manual pressure was immediately applied.  Conservative management of hematoma was discussed with patient. Patient was prophylactically bound with an Ace compression bandage and ice packs were applied. Electronically Signed: By: Valentino Saxon M.D. On: 04/19/2022 13:46   MM CLIP PLACEMENT LEFT  Result Date: 04/19/2022 CLINICAL DATA:  Status post stereotactic guided biopsy EXAM: 3D DIAGNOSTIC LEFT MAMMOGRAM POST STEREOTACTIC BIOPSY COMPARISON:  Previous exam(s). FINDINGS: 3D Mammographic images were obtained following stereotactic guided biopsy of indeterminate calcifications. The X shaped biopsy marking clip is in expected position at the site of biopsy. IMPRESSION: Appropriate positioning of the X shaped biopsy marking clip at the site of biopsy in the upper outer breast at anterior depth. Final Assessment: Post Procedure Mammograms for Marker Placement Electronically Signed   By: Valentino Saxon M.D.   On: 04/19/2022 13:46  MM DIAG BREAST TOMO BILATERAL  Result Date: 03/30/2022 CLINICAL DATA:  Callback for bilateral calcifications. EXAM: DIGITAL DIAGNOSTIC BILATERAL MAMMOGRAM WITH TOMOSYNTHESIS AND CAD TECHNIQUE: Bilateral digital diagnostic mammography and breast tomosynthesis was performed. The images were evaluated with computer-aided detection. COMPARISON:  Previous exam(s). ACR Breast Density Category c: The breast tissue is heterogeneously dense, which may obscure small masses. FINDINGS: Spot magnification views of the RIGHT breast confirm mammographic stability of a loosely associated group of punctate calcifications in the central breast at posterior depth since at least 2015, consistent with a benign etiology. Increased conspicuity on screening mammogram was likely due to tomosynthesis associated artifact. Spot magnification views of the LEFT breast demonstrate a 2 cm group of amorphous calcifications in the upper outer breast at anterior depth. These are not definitively stable in comparison to remote priors. Definitive layering  morphology is not demonstrated on true lateral imaging. Spot magnification views of the LEFT breast demonstrate mammographic stability of scattered layering calcifications in the LEFT lower breast at anterior depth. These are consistent with benign milk of calcium. IMPRESSION: 1. A 2 cm group of amorphous calcifications in the LEFT upper outer breast at anterior depth are indeterminate. Recommend stereotactic guided biopsy for definitive characterization. 2. Multiple additional benign bilateral breast calcifications are noted. RECOMMENDATION: LEFT breast stereotactic guided biopsy x1 I have discussed the findings and recommendations with the patient. The biopsy procedure was discussed with the patient and questions were answered. Patient expressed their understanding of the biopsy recommendation. Patient will be scheduled for biopsy at her earliest convenience by the schedulers. Ordering provider will be notified. If applicable, a reminder letter will be sent to the patient regarding  the next appointment. BI-RADS CATEGORY  4: Suspicious. Electronically Signed   By: Valentino Saxon M.D.   On: 03/30/2022 16:10   Assessment and plan- Patient is a 66 y.o. female with newly diagnosed left breast DCIS here to discuss further management  Discussed the results of mammogram and Ultrasound with the patient which shows a 2 cm group of calcifications in the upper outer quadrant of the left breast.  This was biopsy-proven DCIS low-grade.  Discussed that for DCIS I would recommend upfront lumpectomy.  Ideally patient should get 2 mm negative margins.  Following DCIS she would benefit from adjuvant radiation therapy.  Her DCIS was more than 90% positive and therefore she would also benefit from adjuvant endocrine therapy for 5 years.  Discussed that both tamoxifen and aromatase inhibitors are options for her.  Discussed risks and benefits of tamoxifen including all but not limited to possible risk of uterine cancer  cataracts and DVT as well as hot flashes and mood swings.  Discussed risks and benefits of aromatase inhibitors including all but not limited to arthralgias, mood swings, hot flashes and worsening bone health.  I will obtain a baseline bone density scan at this time and based on that we will decide if tamoxifen or aromatase inhibitor would be a good option for her.  I will tentatively see her back in 3 weeks from now   Thank you for this kind referral and the opportunity to participate in the care of this  Patient   Visit Diagnosis 1. Ductal carcinoma in situ (DCIS) of left breast   2. Goals of care, counseling/discussion     Dr. Randa Evens, MD, MPH [facility] [phone] 04/26/2022

## 2022-04-26 NOTE — Progress Notes (Signed)
New pt referred by Dr Sabra Heck left breast DCIS. Pt husband is present today as well.

## 2022-04-26 NOTE — Progress Notes (Signed)
Accompanied patient and family to initial medical oncology appointment.   Reviewed Breast Cancer treatment handbook.   Care plan summary given to patient.   Reviewed outreach programs and cancer center services.   

## 2022-04-27 ENCOUNTER — Other Ambulatory Visit: Payer: Self-pay

## 2022-04-27 DIAGNOSIS — D0512 Intraductal carcinoma in situ of left breast: Secondary | ICD-10-CM

## 2022-05-01 ENCOUNTER — Encounter: Payer: Self-pay | Admitting: Internal Medicine

## 2022-05-03 ENCOUNTER — Telehealth: Payer: Self-pay | Admitting: Urgent Care

## 2022-05-03 ENCOUNTER — Encounter
Admission: RE | Admit: 2022-05-03 | Discharge: 2022-05-03 | Disposition: A | Discharge status: Home / Self Care | Payer: BC Managed Care – PPO | Source: Ambulatory Visit | Attending: General Surgery | Admitting: General Surgery

## 2022-05-03 ENCOUNTER — Other Ambulatory Visit: Payer: Self-pay | Admitting: General Surgery

## 2022-05-03 VITALS — Ht 66.0 in | Wt 213.0 lb

## 2022-05-03 DIAGNOSIS — T502X5A Adverse effect of carbonic-anhydrase inhibitors, benzothiadiazides and other diuretics, initial encounter: Secondary | ICD-10-CM | POA: Insufficient documentation

## 2022-05-03 DIAGNOSIS — Z01812 Encounter for preprocedural laboratory examination: Secondary | ICD-10-CM

## 2022-05-03 DIAGNOSIS — C50919 Malignant neoplasm of unspecified site of unspecified female breast: Secondary | ICD-10-CM | POA: Diagnosis not present

## 2022-05-03 DIAGNOSIS — D0512 Intraductal carcinoma in situ of left breast: Secondary | ICD-10-CM | POA: Diagnosis present

## 2022-05-03 DIAGNOSIS — E669 Obesity, unspecified: Secondary | ICD-10-CM | POA: Diagnosis not present

## 2022-05-03 DIAGNOSIS — I10 Essential (primary) hypertension: Secondary | ICD-10-CM

## 2022-05-03 DIAGNOSIS — I119 Hypertensive heart disease without heart failure: Secondary | ICD-10-CM | POA: Diagnosis not present

## 2022-05-03 DIAGNOSIS — E876 Hypokalemia: Secondary | ICD-10-CM | POA: Insufficient documentation

## 2022-05-03 HISTORY — DX: Prediabetes: R73.03

## 2022-05-03 LAB — CBC
HCT: 40.6 % (ref 36.0–46.0)
Hemoglobin: 12.9 g/dL (ref 12.0–15.0)
MCH: 26.8 pg (ref 26.0–34.0)
MCHC: 31.8 g/dL (ref 30.0–36.0)
MCV: 84.4 fL (ref 80.0–100.0)
Platelets: 265 10*3/uL (ref 150–400)
RBC: 4.81 MIL/uL (ref 3.87–5.11)
RDW: 14.3 % (ref 11.5–15.5)
WBC: 7.6 10*3/uL (ref 4.0–10.5)
nRBC: 0 % (ref 0.0–0.2)

## 2022-05-03 LAB — BASIC METABOLIC PANEL
Anion gap: 8 (ref 5–15)
BUN: 15 mg/dL (ref 8–23)
CO2: 28 mmol/L (ref 22–32)
Calcium: 9.5 mg/dL (ref 8.9–10.3)
Chloride: 103 mmol/L (ref 98–111)
Creatinine, Ser: 0.91 mg/dL (ref 0.44–1.00)
GFR, Estimated: >60 mL/min (ref >60)
Glucose, Bld: 103 mg/dL — ABNORMAL HIGH (ref 70–99)
Potassium: 3 mmol/L — ABNORMAL LOW (ref 3.5–5.1)
Sodium: 139 mmol/L (ref 135–145)

## 2022-05-03 MED ORDER — POTASSIUM CHLORIDE CRYS ER 20 MEQ PO TBCR: EXTENDED_RELEASE_TABLET | ORAL | 0 refills | Status: DC

## 2022-05-03 NOTE — Progress Notes (Signed)
Progress Notes - documented in this encounter Jacee Enerson, Geronimo Boot, MD - 04/27/2022 11:15 AM EDT Formatting of this note is different from the original. Images from the original note were not included. Subjective:   Patient ID: Kristine Rose is a 66 y.o. female.  HPI  The following portions of the patient's history were reviewed and updated as appropriate.  This a new patient is here today for: office visit. Here for evaluation of left breast cancer referred by Dr Sabra Heck.  She had a left stereotactic breast biopsy on 04-19-22.  Patient reports no breast changes prior to her recent mammogram. The patient reports that with the exception of the pandemic patient was having annual mammograms. She denies any discharge.   She wears a size 38 B bra.   She is here with her husband, Rush Landmark.   Review of Systems  Constitutional: Negative for chills and fever.  Respiratory: Negative for cough.   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 Kurtis Bushman @ [place] - Adenomatous Polyp, FHPolyps(m), rpt 5 yrs per [facility]  COLONOSCOPY 12/13/2017  Serrated adenoma/Repeat 66yr/[facility]US  HYSTERECTOMY  TAH ovaries remain  HYSTEROSCOPY  with D&C  LAPAROSCOPIC TUBAL LIGATION  Lipoma removal Right  Shoulder  TONSILLECTOMY & ADENOIDECTOMY  TUBAL LIGATION    OB History   Gravida  2  Para  2  Term  2  Preterm   AB   Living  2    SAB   IAB   Ectopic   Molar   Multiple   Live Births     Obstetric Comments  Age at first period 89Age of first pregnancy 89     Social History    Socioeconomic History  Marital status: Married  Tobacco Use  Smoking status: Never  Smokeless tobacco: Never  Vaping Use  Vaping Use: Never used  Substance and Sexual Activity  Alcohol use: Yes  Alcohol/week: 0.0 standard drinks  Comment: Socially  Drug use: No  Sexual activity: Not Currently  Partners: Male  Birth control/protection: Surgical    Allergies  Allergen Reactions  Latex Other (See Comments)  Blisters, redness, and itching  Pravastatin Other (See Comments)  Joint pain   Current Outpatient Medications  Medication Sig Dispense Refill  aspirin 81 MG EC tablet Take 81 mg by mouth once daily.  carvediloL (COREG) 25 MG tablet TAKE 1 TABLET BY MOUTH TWICE A DAY WITH FOOD 180 tablet 3  cyanocobalamin (VITAMIN B12) 1000 MCG tablet TAKE 1 TABLET BY MOUTH ONCE DAILY 100 tablet 1  hydroCHLOROthiazide (HYDRODIURIL) 25 MG tablet Take 1 tablet (25 mg total) by mouth once daily 90 tablet 3  Herbal Supplement Herbal Name: Vitamins D,C magnesium, Iron B12 (Patient not taking: Reported on 04/27/2022)   No current facility-administered medications for this visit.   Family History  Problem Relation Age of Onset  Heart disease Mother  High blood pressure (Hypertension) Mother  Diabetes Mother  Myocardial Infarction (Heart attack) Mother  x4  Colon polyps Mother  Heart disease Father  High blood pressure (Hypertension) Father  Myocardial Infarction (Heart attack) Maternal Grandmother  Breast cancer Neg Hx  Colon cancer Neg Hx   Labs and Radiology:   April 19, 2022 left breast stereotactic biopsy:  DIAGNOSIS:  A. BREAST, LEFT, UPPER OUTER; STEREOTACTIC-GUIDED CORE BIOPSY:  - DUCTAL CARCINOMA IN SITU (DCIS), LOW GRADE, ASSOCIATED WITH  CALCIFICATIONS.   Comment:  No invasive carcinoma is seen in this specimen.  DCIS is present in 2 blocks with maximal span of 4 mm.  DCIS shows low nuclear grade, predominantly cribriform architecture, and  absent necrosis.  There is  abundant background blood clot.   CASE SUMMARY: BREAST BIOMARKER TESTS  Estrogen Receptor (ER) Status: POSITIVE  Percentage of cells with nuclear positivity: Greater than 90%  Average intensity of staining: Strong   Imaging review:  September 23, 2019 through April 19, 2021 imaging studies were reviewed. New areas of microcalcification in the left breast.  Left breast ultrasound April 27, 2022:  Ultrasound was completed to determine if preoperative wire localization would be required. An area of marked architectural distortion corresponding to the recent biopsy site in the upper outer quadrant of the left breast is noted. This measures 0.6 x 1.0 x 1.24 cm. BI-RADS-6.  Colonoscopy/ pathology of December 13, 2017:  A. COLON POLYP, CECUM; COLD SNARE:  - SESSILE SERRATED ADENOMA.  - NEGATIVE FOR CYTOLOGIC DYSPLASIA AND MALIGNANCY.  Size: aggregate, 2.0 x 1.0 x 0.1 cm  A 14 mm polyp was found in the cecum.  B. COLON POLYP, SPLENIC FLEXURE; COLD BIOPSY:  - CONSISTENT WITH EARLY SESSILE SERRATED ADENOMA.  - NEGATIVE FOR CYTOLOGIC DYSPLASIA AND MALIGNANCY.  A 3 mm polyp was found in the splenic flexure.  C. COLON POLYP, DESCENDING; COLD BIOPSY:  - MILD MUCOSAL EDEMA.  - DEEPER LEVELS WERE EXAMINED.   D. COLON POLYP, SIGMOID; COLD BIOPSY:  - FOCAL HYPERPLASTIC CHANGE (1).  - NO PATHOLOGIC CHANGE (MULTIPLE).  - DEEPER LEVELS WERE EXAMINED.   E. RECTUM POLYPS 4; COLD BIOPSY:  - MUCOSAL EDEMA (2).  - PROMINENT LYMPHOID AGGREGATE (1).  - DEEPER LEVELS WERE EXAMINED.  Objective:  Physical Exam Exam conducted with a chaperone present.  Constitutional:  Appearance: Normal appearance.  Cardiovascular:  Rate and Rhythm: Normal rate and regular rhythm.  Pulses: Normal pulses.  Heart sounds: Normal heart sounds.  Pulmonary:  Effort: Pulmonary effort is normal.  Breath sounds: Normal breath sounds.  Chest:  Breasts: Right: Normal.  Left: Mass: biopsy site.   Musculoskeletal:   Cervical back: Neck supple.  Lymphadenopathy:  Upper Body:  Right upper body: No supraclavicular or axillary adenopathy.  Left upper body: No supraclavicular or axillary adenopathy.  Skin: General: Skin is warm and dry.  Neurological:  Mental Status: She is alert and oriented to person, place, and time.  Psychiatric:  Mood and Affect: Mood normal.  Behavior: Behavior normal.    Assessment:   Low-grade DCIS of the left breast.  Prior resection of 14 mm sessile serrated adenoma from the cecum in 2019, small sessile serrated adenoma of the splenic flexure. Follow-up endoscopy presently due.  Plan:   The patient had met with Randa Evens, MD with medical oncology as well as Jeral Fruit, RN from the research protocol team prior to today's visit. She had been informed of the opportunity to participate in the, trial for low-grade DCIS being managed either standard treatment (surgery, radiation, estrogen blockade) versus estrogen blockade alone. A significant amount of time was spent reviewing the low risk from this lesion and the potential benefit to  future generations of women of determining if the present extensive treatment regimen is appropriate. After consideration she does has desire to decline participation in the study.  Indications for wide local excision, potential for positive margins and typical role for postoperative adjuvant radiation therapy and hormone blockade was discussed.  At this time she is amenable to proceed to surgery, presently scheduled for May 05, 2022.  We will review indications for follow-up colonoscopy after her upcoming surgery.   This note is partially prepared by Karie Fetch, RN, acting as a scribe in the presence of Dr. Hervey Ard, MD. . The documentation recorded by the scribe accurately reflects the service I personally performed and the decisions made by me.   Robert Bellow, MD FACS

## 2022-05-03 NOTE — Progress Notes (Signed)
  [facility] Perioperative Services: Pre-Admission/Anesthesia Testing  Abnormal Lab Notification   Date: 05/03/22  Name: Kristine Rose MRN:   [id number]  Re: Abnormal labs noted during PAT appointment   Notified:  Provider Name Provider Role Notification Mode  Hervey Ard, MD  General Surgery Routed and/or faxed via [place] and Notes:  ABNORMAL LAB VALUE(S): Lab Results  Component Value Date   K 3.0 (L) 05/03/2022   Kristine Rose is scheduled for a LEFT BREAST LUMPECTOMY on 05/05/2022. In review of her medication reconciliation, it is noted that the patient is is taking prescribed diuretic medications (HCTZ 25 mg). Please note, in efforts to promote a safe and effective anesthetic course, per current guidelines/standards set by the [facility] anesthesia team, the minimal acceptable K+ level for the patient to proceed with general anesthesia is 3.0 mmol/L. With that being said, if the patient drops any lower, her elective procedure will need to be postponed until K+ is better optimized.  In efforts to prevent case being postponed, hypokalemia treated by PAT APP.   PlansL  Margie Urbanowicz noted be hypokalemic on preoperative labs.  K+ level 3.0 mmol/L.  This is presumably diuretic induced as she takes HCTZ 25 mg daily.  Patient called to discuss.  We will plan on treating noted electrolyte derangement as follows:  Meds ordered this encounter  Medications   potassium chloride SA (KLOR-CON M) 20 MEQ tablet    Sig: Take 2 tablets (40 mEq) today, then 1 tablet (20 mEq) tomorrow.  Last dose of 1 tablet (20 mEq) to be taken on the day of surgery.    Dispense:  4 tablet    Refill:  0   Order placed to have K+ rechecked on the day of her procedure to ensure adequate correction of her K+ level allowing for her to proceed safely with general anesthesia.  Encourage patient to follow-up with PCP postoperatively to have her labs rechecked and to  discuss consideration of change in diuretic therapy to a K+ sparing drug vs. potential addition of daily oral K+ supplementation.  Encounter Diagnoses  Name Primary?   Pre-operative laboratory examination Yes   Diuretic-induced hypokalemia    Honor Loh, MSN, APRN, FNP-C, CEN [facility]  Peri-operative Services Nurse Practitioner Phone: [phone] Fax: [phone] 05/03/22 4:10 PM

## 2022-05-03 NOTE — Patient Instructions (Addendum)
Your procedure is scheduled on: Friday May 05, 2022. Report to Day Surgery inside [facility] 2nd floor, stop by admissions desk before getting on elevator.  To find out your arrival time please call [phone] between 1PM - 3PM on Thursday May 04, 2022.  Remember: Instructions that are not followed completely may result in serious medical risk,  up to and including death, or upon the discretion of your surgeon and anesthesiologist your  surgery may need to be rescheduled.     _X__ 1. Do not eat food after midnight the night before your procedure.                 No chewing gum or hard candies. You may drink clear liquids up to 2 hours                 before you are scheduled to arrive for your surgery- DO not drink clear                 liquids within 2 hours of the start of your surgery.                 Clear Liquids include:  water, apple juice without pulp, clear Gatorade, G2 or                  Gatorade Zero (avoid Red/Purple/Blue), Black Coffee or Tea (Do not add                 anything to coffee or tea).  __X__2.  On the morning of surgery brush your teeth with toothpaste and water, you                may rinse your mouth with mouthwash if you wish.  Do not swallow any toothpaste or mouthwash.     _X__ 3.  No Alcohol for 24 hours before or after surgery.   _X__ 4.  Do Not Smoke or use e-cigarettes For 24 Hours Prior to Your Surgery.                 Do not use any chewable tobacco products for at least 6 hours prior to                 Surgery.  _X__  5.  Do not use any recreational drugs (marijuana, cocaine, heroin, ecstasy, MDMA or other)                For at least one week prior to your surgery.  Combination of these drugs with anesthesia                May have life threatening results.  ____  6.  Bring all medications with you on the day of surgery if instructed.   __X__  7.  Notify your doctor if there is any change in your medical condition       (cold, fever, infections).     Do not wear jewelry, make-up, hairpins, clips or nail polish. Do not wear lotions, powders, or perfumes or deodorant. Do not shave 48 hours prior to surgery.  Do not bring valuables to the hospital.    [facility] is not responsible for any belongings or valuables.  Contacts, dentures or bridgework may not be worn into surgery. Leave your suitcase in the car. After surgery it may be brought to your room. For patients admitted to the hospital, discharge time is determined by your treatment team.  Patients discharged the day of surgery will not be allowed to drive home.   Make arrangements for someone to be with you for the first 24 hours of your Same Day Discharge.   __X__ Take these medicines the morning of surgery with A SIP OF WATER:    1. carvedilol (COREG) 25 MG   2.   3.   4.  5.  6.  ____ Fleet Enema (as directed)   __X__ Use CHG Soap (or wipes) as directed  ____ Use Benzoyl Peroxide Gel as instructed  ____ Use inhalers on the day of surgery  ____ Stop metformin 2 days prior to surgery    ____ Take 1/2 of usual insulin dose the night before surgery. No insulin the morning          of surgery.   __X__ Stop aspirin 81 mg 05/03/22 as instructed by your surgeon.   __X__ One Week prior to surgery- Stop Anti-inflammatories such as Ibuprofen, Aleve, Advil, Motrin, meloxicam (MOBIC), diclofenac, etodolac, ketorolac, Toradol, Daypro, piroxicam, Goody's or BC powders. OK TO USE TYLENOL IF NEEDED   __X__ Stop supplements until after surgery.    ____ Bring C-Pap to the hospital.    If you have any questions regarding your pre-procedure instructions,  Please call Pre-admit Testing at [phone]

## 2022-05-04 MED ORDER — FAMOTIDINE 20 MG PO TABS: 20.0000 mg | ORAL_TABLET | Freq: Once | ORAL | Status: AC

## 2022-05-04 MED ORDER — CHLORHEXIDINE GLUCONATE CLOTH 2 % EX PADS
6.0000 | MEDICATED_PAD | Freq: Once | CUTANEOUS | Status: AC
  Administered 2022-05-05: 6 via TOPICAL

## 2022-05-04 MED ORDER — CHLORHEXIDINE GLUCONATE 0.12 % MT SOLN: 15.0000 mL | Freq: Once | OROMUCOSAL | Status: AC

## 2022-05-04 MED ORDER — CHLORHEXIDINE GLUCONATE CLOTH 2 % EX PADS: 6.0000 | MEDICATED_PAD | Freq: Once | CUTANEOUS | Status: DC

## 2022-05-04 MED ORDER — ORAL CARE MOUTH RINSE: 15.0000 mL | Freq: Once | OROMUCOSAL | Status: AC

## 2022-05-04 MED ORDER — LACTATED RINGERS IV SOLN: INTRAVENOUS | Status: DC

## 2022-05-05 ENCOUNTER — Ambulatory Visit: Payer: BC Managed Care – PPO | Admitting: Urgent Care

## 2022-05-05 ENCOUNTER — Ambulatory Visit
Admission: RE | Admit: 2022-05-05 | Discharge: 2022-05-05 | Disposition: A | Discharge status: Home / Self Care | Payer: BC Managed Care – PPO | Attending: General Surgery | Admitting: General Surgery

## 2022-05-05 ENCOUNTER — Other Ambulatory Visit: Payer: Self-pay

## 2022-05-05 ENCOUNTER — Encounter
Admission: RE | Disposition: A | Discharge status: Home / Self Care | Payer: Self-pay | Source: Home / Self Care | Attending: General Surgery

## 2022-05-05 ENCOUNTER — Encounter: Payer: Self-pay | Admitting: General Surgery

## 2022-05-05 ENCOUNTER — Ambulatory Visit: Payer: BC Managed Care – PPO

## 2022-05-05 ENCOUNTER — Ambulatory Visit: Payer: BC Managed Care – PPO | Admitting: Certified Registered Nurse Anesthetist

## 2022-05-05 DIAGNOSIS — D0512 Intraductal carcinoma in situ of left breast: Secondary | ICD-10-CM | POA: Insufficient documentation

## 2022-05-05 DIAGNOSIS — E669 Obesity, unspecified: Secondary | ICD-10-CM | POA: Insufficient documentation

## 2022-05-05 DIAGNOSIS — I119 Hypertensive heart disease without heart failure: Secondary | ICD-10-CM | POA: Insufficient documentation

## 2022-05-05 DIAGNOSIS — C50919 Malignant neoplasm of unspecified site of unspecified female breast: Secondary | ICD-10-CM | POA: Diagnosis not present

## 2022-05-05 DIAGNOSIS — E876 Hypokalemia: Secondary | ICD-10-CM

## 2022-05-05 DIAGNOSIS — Z01812 Encounter for preprocedural laboratory examination: Secondary | ICD-10-CM

## 2022-05-05 HISTORY — PX: BREAST LUMPECTOMY: SHX2

## 2022-05-05 LAB — POCT I-STAT, CHEM 8
BUN: 13 mg/dL (ref 8–23)
Calcium, Ion: 1.26 mmol/L (ref 1.15–1.40)
Chloride: 105 mmol/L (ref 98–111)
Creatinine, Ser: 0.8 mg/dL (ref 0.44–1.00)
Glucose, Bld: 111 mg/dL — ABNORMAL HIGH (ref 70–99)
HCT: 42 % (ref 36.0–46.0)
Hemoglobin: 14.3 g/dL (ref 12.0–15.0)
Potassium: 3.6 mmol/L (ref 3.5–5.1)
Sodium: 142 mmol/L (ref 135–145)
TCO2: 24 mmol/L (ref 22–32)

## 2022-05-05 SURGERY — BREAST LUMPECTOMY
Anesthesia: General | Site: Breast | Laterality: Left

## 2022-05-05 MED ORDER — PHENYLEPHRINE HCL (PRESSORS) 10 MG/ML IV SOLN
INTRAVENOUS | Status: DC | PRN
  Administered 2022-05-05: 80 ug via INTRAVENOUS
  Administered 2022-05-05: 160 ug via INTRAVENOUS
  Administered 2022-05-05: 80 ug via INTRAVENOUS
  Administered 2022-05-05: 160 ug via INTRAVENOUS

## 2022-05-05 MED ORDER — DEXAMETHASONE SODIUM PHOSPHATE 10 MG/ML IJ SOLN
INTRAMUSCULAR | Status: DC | PRN
  Administered 2022-05-05: 10 mg via INTRAVENOUS

## 2022-05-05 MED ORDER — FENTANYL CITRATE (PF) 100 MCG/2ML IJ SOLN
25.0000 ug | INTRAMUSCULAR | Status: DC | PRN
  Administered 2022-05-05 (×3): 25 ug via INTRAVENOUS

## 2022-05-05 MED ORDER — MIDAZOLAM HCL 2 MG/2ML IJ SOLN
INTRAMUSCULAR | Status: DC | PRN
  Administered 2022-05-05: 2 mg via INTRAVENOUS

## 2022-05-05 MED ORDER — LACTATED RINGERS IV SOLN: INTRAVENOUS | Status: DC

## 2022-05-05 MED ORDER — ACETAMINOPHEN 10 MG/ML IV SOLN
INTRAVENOUS | Status: DC | PRN
  Administered 2022-05-05: 1000 mg via INTRAVENOUS

## 2022-05-05 MED ORDER — STERILE WATER FOR IRRIGATION IR SOLN
Status: DC | PRN
  Administered 2022-05-05: 1000 mL

## 2022-05-05 MED ORDER — METHYLENE BLUE 1 % INJ SOLN
INTRAVENOUS | Status: AC
  Filled 2022-05-05: qty 10

## 2022-05-05 MED ORDER — OXYCODONE HCL 5 MG PO TABS: 5.0000 mg | ORAL_TABLET | Freq: Once | ORAL | Status: AC | PRN

## 2022-05-05 MED ORDER — OXYCODONE HCL 5 MG/5ML PO SOLN: 5.0000 mg | Freq: Once | ORAL | Status: AC | PRN

## 2022-05-05 MED ORDER — DEXAMETHASONE SODIUM PHOSPHATE 10 MG/ML IJ SOLN
INTRAMUSCULAR | Status: AC
  Filled 2022-05-05: qty 1

## 2022-05-05 MED ORDER — ACETAMINOPHEN 10 MG/ML IV SOLN
INTRAVENOUS | Status: AC
  Filled 2022-05-05: qty 100

## 2022-05-05 MED ORDER — CHLORHEXIDINE GLUCONATE 0.12 % MT SOLN
OROMUCOSAL | Status: AC
  Administered 2022-05-05: 15 mL via OROMUCOSAL
  Filled 2022-05-05: qty 15

## 2022-05-05 MED ORDER — FENTANYL CITRATE (PF) 100 MCG/2ML IJ SOLN
INTRAMUSCULAR | Status: AC
  Administered 2022-05-05: 25 ug via INTRAVENOUS
  Filled 2022-05-05: qty 2

## 2022-05-05 MED ORDER — MIDAZOLAM HCL 2 MG/2ML IJ SOLN
INTRAMUSCULAR | Status: AC
  Filled 2022-05-05: qty 2

## 2022-05-05 MED ORDER — BUPIVACAINE-EPINEPHRINE (PF) 0.5% -1:200000 IJ SOLN
INTRAMUSCULAR | Status: AC
  Filled 2022-05-05: qty 30

## 2022-05-05 MED ORDER — PROPOFOL 10 MG/ML IV BOLUS
INTRAVENOUS | Status: DC | PRN
  Administered 2022-05-05: 200 mg via INTRAVENOUS

## 2022-05-05 MED ORDER — ONDANSETRON HCL 4 MG/2ML IJ SOLN: 4.0000 mg | Freq: Once | INTRAMUSCULAR | Status: DC | PRN

## 2022-05-05 MED ORDER — PHENYLEPHRINE 80 MCG/ML (10ML) SYRINGE FOR IV PUSH (FOR BLOOD PRESSURE SUPPORT)
PREFILLED_SYRINGE | INTRAVENOUS | Status: AC
  Filled 2022-05-05: qty 10

## 2022-05-05 MED ORDER — ONDANSETRON HCL 4 MG/2ML IJ SOLN
INTRAMUSCULAR | Status: AC
  Filled 2022-05-05: qty 2

## 2022-05-05 MED ORDER — OXYCODONE HCL 5 MG PO TABS
ORAL_TABLET | ORAL | Status: AC
  Administered 2022-05-05: 5 mg via ORAL
  Filled 2022-05-05: qty 1

## 2022-05-05 MED ORDER — FAMOTIDINE 20 MG PO TABS
ORAL_TABLET | ORAL | Status: AC
  Administered 2022-05-05: 20 mg via ORAL
  Filled 2022-05-05: qty 1

## 2022-05-05 MED ORDER — FENTANYL CITRATE (PF) 100 MCG/2ML IJ SOLN
INTRAMUSCULAR | Status: AC
  Filled 2022-05-05: qty 2

## 2022-05-05 MED ORDER — LIDOCAINE HCL (CARDIAC) PF 100 MG/5ML IV SOSY
PREFILLED_SYRINGE | INTRAVENOUS | Status: DC | PRN
  Administered 2022-05-05: 100 mg via INTRAVENOUS

## 2022-05-05 MED ORDER — BUPIVACAINE-EPINEPHRINE (PF) 0.5% -1:200000 IJ SOLN
INTRAMUSCULAR | Status: DC | PRN
  Administered 2022-05-05: 30 mL

## 2022-05-05 MED ORDER — KETOROLAC TROMETHAMINE 30 MG/ML IJ SOLN
INTRAMUSCULAR | Status: AC
  Filled 2022-05-05: qty 1

## 2022-05-05 MED ORDER — ACETAMINOPHEN 10 MG/ML IV SOLN: 1000.0000 mg | Freq: Once | INTRAVENOUS | Status: DC | PRN

## 2022-05-05 MED ORDER — FENTANYL CITRATE (PF) 100 MCG/2ML IJ SOLN
INTRAMUSCULAR | Status: DC | PRN
Start: 2022-05-05 — End: 2022-05-05
  Administered 2022-05-05 (×3): 25 ug via INTRAVENOUS

## 2022-05-05 MED ORDER — EPHEDRINE SULFATE (PRESSORS) 50 MG/ML IJ SOLN
INTRAMUSCULAR | Status: DC | PRN
  Administered 2022-05-05 (×3): 5 mg via INTRAVENOUS
  Administered 2022-05-05: 10 mg via INTRAVENOUS

## 2022-05-05 MED ORDER — LIDOCAINE HCL (PF) 2 % IJ SOLN
INTRAMUSCULAR | Status: AC
  Filled 2022-05-05: qty 5

## 2022-05-05 MED ORDER — FAMOTIDINE 20 MG PO TABS
ORAL_TABLET | ORAL | Status: AC
  Filled 2022-05-05: qty 1

## 2022-05-05 MED ORDER — ONDANSETRON HCL 4 MG/2ML IJ SOLN
INTRAMUSCULAR | Status: DC | PRN
  Administered 2022-05-05: 4 mg via INTRAVENOUS

## 2022-05-05 MED ORDER — HYDROCODONE-ACETAMINOPHEN 5-325 MG PO TABS: 1.0000 | ORAL_TABLET | ORAL | 0 refills | Status: DC | PRN

## 2022-05-05 MED ORDER — PROPOFOL 10 MG/ML IV BOLUS
INTRAVENOUS | Status: AC
  Filled 2022-05-05: qty 20

## 2022-05-05 MED ORDER — KETOROLAC TROMETHAMINE 30 MG/ML IJ SOLN
INTRAMUSCULAR | Status: DC | PRN
  Administered 2022-05-05: 30 mg via INTRAVENOUS

## 2022-05-05 SURGICAL SUPPLY — 56 items
APL PRP STRL LF DISP 70% ISPRP (MISCELLANEOUS) ×1
BINDER BREAST LRG (GAUZE/BANDAGES/DRESSINGS) ×1 IMPLANT
BLADE BOVIE TIP EXT 4 (BLADE) IMPLANT
BLADE PHOTON ILLUMINATED (MISCELLANEOUS) IMPLANT
BLADE SURG 15 STRL SS SAFETY (BLADE) ×4 IMPLANT
BULB RESERV EVAC DRAIN JP 100C (MISCELLANEOUS) IMPLANT
CHLORAPREP W/TINT 26 (MISCELLANEOUS) ×2 IMPLANT
CNTNR SPEC 2.5X3XGRAD LEK (MISCELLANEOUS)
CONT SPEC 4OZ STER OR WHT (MISCELLANEOUS)
CONT SPEC 4OZ STRL OR WHT (MISCELLANEOUS)
CONTAINER SPEC 2.5X3XGRAD LEK (MISCELLANEOUS) IMPLANT
COVER PROBE FLX POLY STRL (MISCELLANEOUS) ×2 IMPLANT
DEVICE DUBIN SPECIMEN MAMMOGRA (MISCELLANEOUS) ×2 IMPLANT
DRAIN CHANNEL JP 15F RND 16 (MISCELLANEOUS) IMPLANT
DRAPE LAPAROTOMY TRNSV 106X77 (MISCELLANEOUS) ×2 IMPLANT
DRSG GAUZE FLUFF 36X18 (GAUZE/BANDAGES/DRESSINGS) ×2 IMPLANT
DRSG TELFA 3X8 NADH (GAUZE/BANDAGES/DRESSINGS) ×2 IMPLANT
ELECT CAUTERY BLADE 6.4 (BLADE) ×2 IMPLANT
ELECT CAUTERY BLADE TIP 2.5 (TIP)
ELECT REM PT RETURN 9FT ADLT (ELECTROSURGICAL) ×2
ELECTRODE CAUTERY BLDE TIP 2.5 (TIP) IMPLANT
ELECTRODE REM PT RTRN 9FT ADLT (ELECTROSURGICAL) ×1 IMPLANT
GAUZE 4X4 16PLY ~~LOC~~+RFID DBL (SPONGE) ×2 IMPLANT
GLOVE BIO SURGEON STRL SZ7.5 (GLOVE) ×2 IMPLANT
GLOVE SURG UNDER LTX SZ8 (GLOVE) ×2 IMPLANT
GOWN STRL REUS W/ TWL LRG LVL3 (GOWN DISPOSABLE) ×2 IMPLANT
GOWN STRL REUS W/TWL LRG LVL3 (GOWN DISPOSABLE) ×4
KIT TURNOVER KIT A (KITS) ×2 IMPLANT
LABEL OR SOLS (LABEL) ×2 IMPLANT
MANIFOLD NEPTUNE II (INSTRUMENTS) ×2 IMPLANT
MARGIN MAP 10MM (MISCELLANEOUS) IMPLANT
NDL HYPO 25X1 1.5 SAFETY (NEEDLE) ×2 IMPLANT
NEEDLE HYPO 22GX1.5 SAFETY (NEEDLE) ×2 IMPLANT
NEEDLE HYPO 25X1 1.5 SAFETY (NEEDLE) ×4 IMPLANT
PACK BASIN MINOR ARMC (MISCELLANEOUS) ×2 IMPLANT
PAD DRESSING TELFA 3X8 NADH (GAUZE/BANDAGES/DRESSINGS) ×1 IMPLANT
PENCIL ELECTRO HAND CTR (MISCELLANEOUS) ×2 IMPLANT
SHEARS FOC LG CVD HARMONIC 17C (MISCELLANEOUS) IMPLANT
SHEARS HARMONIC 9CM CVD (BLADE) IMPLANT
STRIP CLOSURE SKIN 1/2X4 (GAUZE/BANDAGES/DRESSINGS) ×2 IMPLANT
SUT ETHILON 3-0 FS-10 30 BLK (SUTURE) ×2
SUT SILK 2 0 (SUTURE)
SUT SILK 2-0 18XBRD TIE 12 (SUTURE) ×1 IMPLANT
SUT VIC AB 2-0 CT1 27 (SUTURE) ×4
SUT VIC AB 2-0 CT1 TAPERPNT 27 (SUTURE) ×2 IMPLANT
SUT VIC AB 3-0 54X BRD REEL (SUTURE) IMPLANT
SUT VIC AB 3-0 BRD 54 (SUTURE) ×2
SUT VIC AB 4-0 FS2 27 (SUTURE) ×2 IMPLANT
SUTURE EHLN 3-0 FS-10 30 BLK (SUTURE) IMPLANT
SWABSTK COMLB BENZOIN TINCTURE (MISCELLANEOUS) ×2 IMPLANT
SYR 10ML LL (SYRINGE) ×2 IMPLANT
SYR BULB IRRIG 60ML STRL (SYRINGE) ×2 IMPLANT
TAPE TRANSPORE STRL 2 31045 (GAUZE/BANDAGES/DRESSINGS) IMPLANT
TRAP NEPTUNE SPECIMEN COLLECT (MISCELLANEOUS) ×2 IMPLANT
WATER STERILE IRR 1000ML POUR (IV SOLUTION) ×2 IMPLANT
WATER STERILE IRR 500ML POUR (IV SOLUTION) ×2 IMPLANT

## 2022-05-05 NOTE — H&P (Signed)
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   Medications Prior to Admission  Medication Sig Dispense Refill Last Dose   carvedilol (COREG) 25 MG tablet Take 25 mg by mouth 2 (two) times daily with a meal.   05/04/2022   hydrochlorothiazide (HYDRODIURIL) 25 MG tablet Take 25 mg by mouth daily.   05/04/2022   potassium chloride SA (KLOR-CON M) 20 MEQ tablet Take 2 tablets (40 mEq) today, then 1 tablet (20 mEq) tomorrow.  Last dose of 1 tablet (20 mEq) to be taken on the day of surgery. 4 tablet 0 05/05/2022   aspirin EC 81 MG tablet Take 81 mg by mouth daily.   04/26/2022   Allergies  Allergen Reactions   Latex Hives    Redness, itching   Pravastatin     Other reaction(s): Other (See Comments) Joint pain   Past Medical History:  Diagnosis Date   GERD (gastroesophageal reflux disease)    Hypertension    Pre-diabetes    Past Surgical History:  Procedure Laterality Date   ABDOMINAL HYSTERECTOMY     COLONOSCOPY WITH PROPOFOL N/A 12/13/2017   Procedure: COLONOSCOPY WITH PROPOFOL;  Surgeon: Lollie Sails, MD;  Location: [facility] ENDOSCOPY;  Service: Endoscopy;  Laterality: N/A;   Social History   Socioeconomic History   Marital status: Married    Spouse name: Not on file   Number of children: Not on file   Years of education: Not on file   Highest education level: Not on file  Occupational History   Not on file  Tobacco Use   Smoking status: Never   Smokeless tobacco: Never  Vaping Use   Vaping Use: Never used  Substance and Sexual Activity   Alcohol use: Yes    Alcohol/week: 1.0 standard drink of alcohol    Types: 1 Glasses of wine per week    Comment: occasionaly   Drug use: No   Sexual activity: Yes  Other Topics Concern   Not on file  Social History Narrative   Not on file   Social Determinants of Health   Financial Resource Strain: Not on file  Food Insecurity: Not on file  Transportation Needs: Not on file   Physical Activity: Not on file  Stress: Not on file  Social Connections: Not on file  Intimate Partner Violence: Not on file   Social History   Social History Narrative   Not on file     ROS: Negative.     PE: HEENT: Negative. Lungs: Clear. Cardio: RR.    Assessment/Plan:  Proceed with planned removal left breast cancer.  Robert Bellow, MD   05/05/2022

## 2022-05-05 NOTE — Anesthesia Postprocedure Evaluation (Signed)
Anesthesia Post Note  Patient: Kristine Rose  Procedure(s) Performed: BREAST LUMPECTOMY (Left: Breast)  Patient location during evaluation: PACU Anesthesia Type: General Level of consciousness: awake and alert, oriented and patient cooperative Pain management: pain level controlled Vital Signs Assessment: post-procedure vital signs reviewed and stable Respiratory status: spontaneous breathing, nonlabored ventilation and respiratory function stable Cardiovascular status: blood pressure returned to baseline and stable Postop Assessment: adequate PO intake Anesthetic complications: no   No notable events documented.   Last Vitals:  Vitals:   05/05/22 0935 05/05/22 0937  BP: 120/60   Pulse: (!) 54   Resp: 20   Temp:    SpO2:  96%    Last Pain:  Vitals:   05/05/22 0935  TempSrc:   PainSc: 0-No pain                 Darrin Nipper

## 2022-05-05 NOTE — Discharge Instructions (Signed)

## 2022-05-05 NOTE — Anesthesia Procedure Notes (Signed)
Procedure Name: LMA Insertion Date/Time: 05/05/2022 7:41 AM  Performed by: Johnna Acosta, CRNAPre-anesthesia Checklist: Patient identified, Emergency Drugs available, Suction available, Patient being monitored and Timeout performed Patient Re-evaluated:Patient Re-evaluated prior to induction Oxygen Delivery Method: Circle system utilized Preoxygenation: Pre-oxygenation with 100% oxygen Induction Type: IV induction LMA: LMA inserted LMA Size: 4.5 Tube type: Oral Number of attempts: 1 Placement Confirmation: positive ETCO2 and breath sounds checked- equal and bilateral Tube secured with: Tape Dental Injury: Teeth and Oropharynx as per pre-operative assessment

## 2022-05-05 NOTE — Anesthesia Preprocedure Evaluation (Addendum)
Anesthesia Evaluation  Patient identified by MRN, date of birth, ID band Patient awake    Reviewed: Allergy & Precautions, NPO status , Patient's Chart, lab work & pertinent test results  History of Anesthesia Complications Negative for: history of anesthetic complications  Airway Mallampati: II   Neck ROM: Full    Dental  (+) Missing   Pulmonary neg pulmonary ROS,    Pulmonary exam normal breath sounds clear to auscultation       Cardiovascular hypertension, Normal cardiovascular exam Rhythm:Regular Rate:Normal  ECG 05/03/22:  Sinus bradycardia Left axis deviation Left ventricular hypertrophy with QRS widening ( R in aVL , Cornell product ) Cannot rule out Septal infarct (cited on or before 28-Jan-2015)   Neuro/Psych negative neurological ROS     GI/Hepatic GERD  ,  Endo/Other  Prediabetes, obesity  Renal/GU negative Renal ROS     Musculoskeletal   Abdominal   Peds  Hematology Breast CA   Anesthesia Other Findings   Reproductive/Obstetrics                            Anesthesia Physical Anesthesia Plan  ASA: 2  Anesthesia Plan: General   Post-op Pain Management:    Induction: Intravenous  PONV Risk Score and Plan: 3 and Ondansetron, Dexamethasone and Treatment may vary due to age or medical condition  Airway Management Planned: LMA  Additional Equipment:   Intra-op Plan:   Post-operative Plan: Extubation in OR  Informed Consent: I have reviewed the patients History and Physical, chart, labs and discussed the procedure including the risks, benefits and alternatives for the proposed anesthesia with the patient or authorized representative who has indicated his/her understanding and acceptance.     Dental advisory given  Plan Discussed with: CRNA  Anesthesia Plan Comments: (Patient consented for risks of anesthesia including but not limited to:  - adverse reactions to  medications - damage to eyes, teeth, lips or other oral mucosa - nerve damage due to positioning  - sore throat or hoarseness - damage to heart, brain, nerves, lungs, other parts of body or loss of life  Informed patient about role of CRNA in peri- and intra-operative care.  Patient voiced understanding.)        Anesthesia Quick Evaluation

## 2022-05-05 NOTE — Op Note (Signed)
Preoperative diagnosis: DCIS left upper outer quadrant.  Postoperative diagnosis: Same.  Operative procedure: Left breast wide local excision with ultrasound guidance, tissue transfer 10 square cm or less.  Operating surgeon: Hervey Ard, MD.  Anesthesia: General by LMA, Marcaine 0.5% with 1: 200,000 units of epinephrine, 30 cc.  Estimated blood loss: 2 cc.  Clinical note: This 66 year old woman was recent identified with low-grade DCIS in the left breast.  After reviewing options for management she desired to proceed to local excision.  SCD stockings for DVT prevention.  Antibiotics were not indicated.  Operative note: With the patient under adequate general anesthesia the breast was cleansed with ChloraPrep and draped.  Ultrasound was used to identify the prior biopsy cavity and previously placed clip.  This was in the 1 o'clock position of the left breast.  There was still chronic inflammation at the biopsy site within the substance of the areola and was elected to make use of an elliptical skin incision to include the original biopsy incision.  The local anesthesia was infiltrated.  The skin was divided sharply and the remaining dissection electrocautery.  There was evidence of a hematoma cavity of the biopsy site.  The resection was carried back to healthy tissue.  The specimen orientated and specimen radiograph showed the previously placed "X" clip in the center of the specimen.  Subsequent report from pathology showed no gross lesion margins grossly negative.  The breast parenchyma was elevated off the deep adipose plane with cautery to allow obliteration of the dead space and then approximated with interrupted 2-0 Vicryl figure-of-eight sutures in 2 layers.  The subcutaneous adipose layer was closed with similar sutures.  The skin was closed with a running 4-0 Vicryl subcuticular suture.  Benzoin, Steri-Strips, Telfa, fluff gauze and a compressive wrap were applied.  Patient tolerated  the procedure well and was taken to the PACU in stable condition.

## 2022-05-05 NOTE — Transfer of Care (Signed)
Immediate Anesthesia Transfer of Care Note  Patient: Kristine Rose  Procedure(s) Performed: BREAST LUMPECTOMY (Left: Breast)  Patient Location: PACU  Anesthesia Type:General  Level of Consciousness: awake and alert   Airway & Oxygen Therapy: Patient Spontanous Breathing and Patient connected to face mask oxygen  Post-op Assessment: Report given to RN and Post -op Vital signs reviewed and stable  Post vital signs: Reviewed and stable  Last Vitals:  Vitals Value Taken Time  BP 120/60 05/05/22 0935  Temp 36.1 C 05/05/22 0932  Pulse 54 05/05/22 0935  Resp 20 05/05/22 0935  SpO2 96 % 05/05/22 0937    Last Pain:  Vitals:   05/05/22 0935  TempSrc:   PainSc: 0-No pain         Complications: No notable events documented.

## 2022-05-06 ENCOUNTER — Encounter: Payer: Self-pay | Admitting: General Surgery

## 2022-05-08 ENCOUNTER — Other Ambulatory Visit: Payer: Self-pay | Admitting: Anatomic Pathology & Clinical Pathology

## 2022-05-08 LAB — SURGICAL PATHOLOGY

## 2022-05-09 ENCOUNTER — Encounter: Payer: Self-pay | Admitting: Oncology

## 2022-05-10 ENCOUNTER — Encounter: Payer: Self-pay | Admitting: *Deleted

## 2022-05-10 NOTE — Progress Notes (Signed)
Called patient to see how she is doing since her lumpectomy.   She feels well, just some mild soreness.   She had questions about pathology, bone density and EKG.   Answered those questions to her satisfaction.  She will see Dr. Bary Castilla tomorrow for post op follow up and Dr. Rao/Dr. Baruch Gouty on 7/25.

## 2022-05-12 ENCOUNTER — Other Ambulatory Visit: Payer: Self-pay | Admitting: General Surgery

## 2022-05-12 NOTE — Progress Notes (Signed)
Progress Notes - documented in this encounter Sierrah Luevano, Geronimo Boot, MD - 05/11/2022 11:30 AM EDT Formatting of this note is different from the original. Subjective:   Patient ID: Kristine Rose is a 66 y.o. female.  HPI  The following portions of the patient's history were reviewed and updated as appropriate.  This an established patient is here today for: office visit. Patient is here today to follow up from a left breast wide excision done on 05-05-22. The patient reports she is doing well from surgery. She is not on any pain medication at this time. Bowels are moving okay.   The patient is also here to discuss having a colonoscopy. Patient had a colonoscopy last on 12-13-17 with Dr. Donnella Sham. Bowel movements are every other day by patient report. She denies any bleeding or mucus.   She is here with her husband, Rush Landmark.   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 Kurtis Bushman @ [place] - Adenomatous Polyp, FHPolyps(m), rpt 5 yrs per MUS  COLONOSCOPY 12/13/2017  Serrated adenoma/Repeat 63yr/[facility]US  wide excision left breast Left 05/05/2022  DCIS, Dr BBary Castilla HYSTERECTOMY  TAH ovaries remain  HYSTEROSCOPY  with D&C  LAPAROSCOPIC TUBAL LIGATION  Lipoma removal Right  Shoulder  TONSILLECTOMY & ADENOIDECTOMY  TUBAL LIGATION    OB History   Gravida  2  Para  2  Term  2  Preterm   AB   Living  2    SAB   IAB   Ectopic   Molar   Multiple   Live Births     Obstetric Comments  Age at first period 89Age of first pregnancy 89     Social History    Socioeconomic History  Marital status: Married  Tobacco Use  Smoking status: Never  Smokeless tobacco: Never  Vaping Use  Vaping Use: Never used  Substance and Sexual Activity  Alcohol use: Yes  Alcohol/week: 0.0 standard drinks  Comment: Socially  Drug use: No  Sexual activity: Not Currently  Partners: Male  Birth control/protection: Surgical    Allergies  Allergen Reactions  Latex Other (See Comments)  Blisters, redness, and itching  Pravastatin Other (See Comments)  Joint pain   Current Outpatient Medications  Medication Sig Dispense Refill  aspirin 81 MG EC tablet Take 81 mg by mouth once daily.  carvediloL (COREG) 25 MG tablet TAKE 1 TABLET BY MOUTH TWICE A DAY WITH FOOD 180 tablet 3  cyanocobalamin (VITAMIN B12) 1000 MCG tablet TAKE 1 TABLET BY MOUTH ONCE DAILY 100 tablet 1  hydroCHLOROthiazide (HYDRODIURIL) 25 MG tablet Take 1 tablet (25 mg total) by mouth once daily 90 tablet 3  Herbal Supplement Herbal Name: Vitamins D,C magnesium, Iron B12 (Patient not taking: Reported on 04/27/2022)   No current facility-administered medications for this visit.   Family History  Problem Relation Age of Onset  Heart disease Mother  High blood pressure (Hypertension) Mother  Diabetes Mother  Myocardial Infarction (Heart attack) Mother  x4  Colon polyps Mother  Heart disease Father  High blood pressure (  Hypertension) Father  Myocardial Infarction (Heart attack) Maternal Grandmother  Breast cancer Neg Hx  Colon cancer Neg Hx   Labs and Radiology:   May 05, 2022 left breast pathology:  . BREAST, LEFT; EXCISION:  - DUCTAL CARCINOMA IN SITU, INTERMEDIATE GRADE, WITH CALCIFICATIONS AND  FOCAL COMEDO TYPE NECROSIS.  - CLIP AND BIOPSY SITE IDENTIFIED, WITH ASSOCIATED HEMATOMA.  - BACKGROUND BENIGN MAMMARY PARENCHYMA WITH FIBROCYSTIC AND APOCRINE  CHANGES, USUAL DUCTAL HYPERPLASIA, AND FOCAL CHANGES SUGGESTIVE OF  BENIGN INTRADUCTAL PAPILLOMA.  - NEGATIVE FOR INVASIVE  CARCINOMA.  - SEE CAHistologic Type: Ductal carcinoma in situ  Size (Extent) of DCIS: at least 8 mm  Nuclear Grade: Grade II (intermediate)  Necrosis: Present, central   MARGINS  Margin Status: All margins negative for DCIS  Distance from DCIS to closest margin: 5 mm NCER SUMMARY.   December 13, 2017 colonoscopy pathology:  . COLON POLYP, CECUM; COLD SNARE:  - SESSILE SERRATED ADENOMA.  - NEGATIVE FOR CYTOLOGIC DYSPLASIA AND MALIGNANCY.  Size: aggregate, 2.0 x 1.0 x 0.1 cm  B. COLON POLYP, SPLENIC FLEXURE; COLD BIOPSY:  - CONSISTENT WITH EARLY SESSILE SERRATED ADENOMA.  - NEGATIVE FOR CYTOLOGIC DYSPLASIA AND MALIGNANCY.  Size: 0.3 cm   C. COLON POLYP, DESCENDING; COLD BIOPSY:  - MILD MUCOSAL EDEMA.  - DEEPER LEVELS WERE EXAMINED.   D. COLON POLYP, SIGMOID; COLD BIOPSY:  - FOCAL HYPERPLASTIC CHANGE (1).  - NO PATHOLOGIC CHANGE (MULTIPLE).  - DEEPER LEVELS WERE EXAMINED.   E. RECTUM POLYPS 4; COLD BIOPSY:  - MUCOSAL EDEMA (2).  - PROMINENT LYMPHOID AGGREGATE (1).  - DEEPER LEVELS WERE EXAMINED.   Endoscopy report summary: Ruby Cola, MD  A 14 mm polyp was found in the cecum. The polyp was sessile. The polyp was removed with a cold snare. Resection and retrieval were complete. A 3 mm polyp was found in the splenic flexure. The polyp was sessile. The polyp was removed with a cold biopsy forceps. Resection and retrieval were complete. A 3 mm polyp was found in the descending colon. The polyp was sessile. The polyp was removed with a cold biopsy forceps. Resection and retrieval were complete. A 3 mm polyp was found in the sigmoid colon. The polyp was sessile. The polyp was removed with a cold biopsy forceps. Resection and retrieval were complete. Four sessile polyps were found in the rectum. The polyps were 1 to 2 mm in size. Review of Systems  Constitutional: Negative for chills and fever.  Respiratory: Negative for cough.    Objective:  Physical Exam Exam  conducted with a chaperone present.  Constitutional:  Appearance: Normal appearance.  Pulmonary:  Effort: Pulmonary effort is normal.  Breath sounds: Normal breath sounds.  Chest:   Comments: Mild volume loss in the upper outer quadrant left breast. No evidence of hematoma. Minimal bruising. Good areola/nipple orientation. Excellent shoulder range of. Musculoskeletal:  Cervical back: Neck supple.  Skin: General: Skin is warm and dry.  Neurological:  Mental Status: She is alert and oriented to person, place, and time.  Psychiatric:  Mood and Affect: Mood normal.  Behavior: Behavior normal.    Assessment:   Doing well post excision of intermediate grade DCIS, candidate for whole breast radiation. Scheduled appointment with radiation oncology May 23, 2022.   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 Candidate for follow-up colonoscopy. Plan:   We will arrange for colonoscopy after she has completed radiation therapy.  She will review the pros and cons of antiestrogen therapy  with Dr. Janese Banks and medical oncology later this month.  She has been encouraged to continue to wear a bra for support day and night while the breast remains "heavy". She may shower as desired. She will call for any questions.  This note is partially prepared by Ledell Noss, CMA acting as a scribe in the presence of Dr. Hervey Ard, MD.   The documentation recorded by the scribe accurately reflects the service I personally performed and the decisions made by me.   Robert Bellow, MD FACS   Electronically signed by Mayer Masker, MD at 05/12/2022 9:

## 2022-05-23 ENCOUNTER — Encounter: Payer: Self-pay | Admitting: Oncology

## 2022-05-23 ENCOUNTER — Encounter: Payer: Self-pay | Admitting: *Deleted

## 2022-05-23 ENCOUNTER — Inpatient Hospital Stay: Payer: BC Managed Care – PPO | Attending: Oncology | Admitting: Oncology

## 2022-05-23 ENCOUNTER — Ambulatory Visit
Admission: RE | Admit: 2022-05-23 | Discharge: 2022-05-23 | Disposition: A | Discharge status: Home / Self Care | Payer: BC Managed Care – PPO | Source: Ambulatory Visit | Attending: Radiation Oncology | Admitting: Radiation Oncology

## 2022-05-23 VITALS — BP 104/59 | HR 60 | Temp 98.0°F | Resp 16 | Ht 66.0 in | Wt 212.0 lb

## 2022-05-23 DIAGNOSIS — Z79899 Other long term (current) drug therapy: Secondary | ICD-10-CM | POA: Insufficient documentation

## 2022-05-23 DIAGNOSIS — Z7982 Long term (current) use of aspirin: Secondary | ICD-10-CM | POA: Insufficient documentation

## 2022-05-23 DIAGNOSIS — Z170 Estrogen receptor positive status [ER+]: Secondary | ICD-10-CM | POA: Insufficient documentation

## 2022-05-23 DIAGNOSIS — D0512 Intraductal carcinoma in situ of left breast: Secondary | ICD-10-CM | POA: Insufficient documentation

## 2022-05-23 DIAGNOSIS — Z8043 Family history of malignant neoplasm of testis: Secondary | ICD-10-CM | POA: Insufficient documentation

## 2022-05-23 DIAGNOSIS — K219 Gastro-esophageal reflux disease without esophagitis: Secondary | ICD-10-CM | POA: Insufficient documentation

## 2022-05-23 DIAGNOSIS — Z79811 Long term (current) use of aromatase inhibitors: Secondary | ICD-10-CM | POA: Insufficient documentation

## 2022-05-23 DIAGNOSIS — Z8042 Family history of malignant neoplasm of prostate: Secondary | ICD-10-CM | POA: Insufficient documentation

## 2022-05-23 DIAGNOSIS — I10 Essential (primary) hypertension: Secondary | ICD-10-CM | POA: Insufficient documentation

## 2022-05-23 DIAGNOSIS — Z7189 Other specified counseling: Secondary | ICD-10-CM | POA: Diagnosis not present

## 2022-05-23 MED ORDER — LETROZOLE 2.5 MG PO TABS: 2.5000 mg | ORAL_TABLET | Freq: Every day | ORAL | 2 refills | Status: DC

## 2022-05-23 NOTE — Consult Note (Signed)
NEW PATIENT EVALUATION  Name: Kristine Rose  MRN: [id number]  Date:   05/23/2022     DOB: February 01, 1956   This 66 y.o. 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   DIAGNOSIS: The encounter diagnosis was Ductal carcinoma in situ (DCIS) of left breast.   PREVIOUS INVESTIGATIONS:  Mammogram and ultrasound reviewed Clinical notes reviewed Pathology reports reviewed  HPI: Patient is a 66 year old female who presents with an abnormal mammogram of her left breast showing a 2 cm group of amorphous calcifications in the upper outer quadrant of the left breast.   She underwent targeted biopsy which was positive for ER positive ductal carcinoma in situ.  She went on to have a wide local excision for an 8 mm grade 2 ductal carcinoma in situ with some central necrosis present.  Margins were clear at 5 mm.  No regional lymph nodes were submitted.  She has tolerated her surgery well.  She is seen today for radiation oncology opinion.  She specifically denies breast tenderness cough or bone pain.  PLANNED TREATMENT REGIMEN: Hypofractionated whole breast radiation  PAST MEDICAL HISTORY:  has a past medical history of Breast cancer ([place]), GERD (gastroesophageal reflux disease), Hypertension, and Pre-diabetes.    PAST SURGICAL HISTORY:  Past Surgical History:  Procedure Laterality Date   ABDOMINAL HYSTERECTOMY     BREAST LUMPECTOMY Left 05/05/2022   Procedure: BREAST LUMPECTOMY;  Surgeon: Robert Bellow, MD;  Location: [facility] ORS;  Service: General;  Laterality: Left;   COLONOSCOPY WITH PROPOFOL N/A 12/13/2017   Procedure: COLONOSCOPY WITH PROPOFOL;  Surgeon: Lollie Sails, MD;  Location: [facility] ENDOSCOPY;  Service: Endoscopy;  Laterality: N/A;    FAMILY HISTORY: family history  includes Heart disease in her father and mother; Prostate cancer in her father; Stroke in her mother; Testicular cancer in her son.  SOCIAL HISTORY:  reports that she has never smoked. She has never used smokeless tobacco. She reports current alcohol use of about 1.0 standard drink of alcohol per week. She reports that she does not use drugs.  ALLERGIES: Latex and Pravastatin  MEDICATIONS:  Current Outpatient Medications  Medication Sig Dispense Refill   aspirin EC 81 MG tablet Take 81 mg by mouth daily.     carvedilol (COREG) 25 MG tablet Take 25 mg by mouth 2 (two) times daily with a meal.     hydrochlorothiazide (HYDRODIURIL) 25 MG tablet Take 25 mg by mouth daily.     letrozole (FEMARA) 2.5 MG tablet Take 1 tablet (2.5 mg total) by mouth daily. 30 tablet 2   No current facility-administered medications for this encounter.    ECOG PERFORMANCE STATUS:  0 - Asymptomatic  REVIEW OF SYSTEMS: Patient denies any weight loss, fatigue, weakness, fever, chills or night sweats. Patient denies any loss of vision, blurred vision. Patient denies any ringing  of the ears or hearing loss. No irregular heartbeat. Patient denies heart murmur or history of fainting. Patient denies any chest pain or pain radiating to her upper extremities. Patient denies any shortness of breath, difficulty breathing at night, cough or hemoptysis. Patient denies any swelling in the lower legs. Patient denies any nausea vomiting, vomiting of blood, or coffee ground material in the vomitus. Patient denies any stomach pain. Patient states has had normal bowel  movements no significant constipation or diarrhea. Patient denies any dysuria, hematuria or significant nocturia. Patient denies any problems walking, swelling in the joints or loss of balance. Patient denies any skin changes, loss of hair or loss of weight. Patient denies any excessive worrying or anxiety or significant depression. Patient denies any problems with insomnia.  Patient denies excessive thirst, polyuria, polydipsia. Patient denies any swollen glands, patient denies easy bruising or easy bleeding. Patient denies any recent infections, allergies or URI. Patient "s visual fields have not changed significantly in recent time.   PHYSICAL EXAM: There were no vitals taken for this visit. She status post wide local excision of the left breast which is healed well.  No dominant masses noted in either breast.  No axillary or supraclavicular adenopathy is identified.  Well-developed well-nourished patient in NAD. HEENT reveals PERLA, EOMI, discs not visualized.  Oral cavity is clear. No oral mucosal lesions are identified. Neck is clear without evidence of cervical or supraclavicular adenopathy. Lungs are clear to A&P. Cardiac examination is essentially unremarkable with regular rate and rhythm without murmur rub or thrill. Abdomen is benign with no organomegaly or masses noted. Motor sensory and DTR levels are equal and symmetric in the upper and lower extremities. Cranial nerves II through XII are grossly intact. Proprioception is intact. No peripheral adenopathy or edema is identified. No motor or sensory levels are noted. Crude visual fields are within normal range.  LABORATORY DATA: Pathology reports reviewed    RADIOLOGY RESULTS: Mammogram and ultrasound reviewed compatible with above-stated findings   IMPRESSION: Stage 0 ER positive ductal carcinoma in situ status post wide local excision of the left breast in 66 year old female  PLAN: At this time like to go ahead with hypofractionated whole breast radiation over 3 weeks.  Would also boost her scar another 1000 cGy using electron beam.  We will use breath-hold technique for treatment planning purposes this is since this is a left-sided lesion.  Risks and benefits of treatment including possible inclusion of superficial lung skin reaction fatigue alteration of blood counts all were reviewed in detail with the  patient.  She comprehends my treatment plan well.  I have personally set up and ordered CT simulation.  I would like to take this opportunity to thank you for allowing me to participate in the care of your patient.Noreene Filbert, MD

## 2022-05-23 NOTE — Progress Notes (Signed)
Hematology/Oncology Consult note [facility]  Telephone:([phone] [phone]  Patient Care Team: Rusty Aus, MD as PCP - General (Internal Medicine) Daiva Huge, RN as Oncology Nurse Navigator   Name of the patient: Kristine Rose  [id number]  Jan 11, 1956   Date of visit: 05/23/22  Diagnosis-left breast DCIS  Chief complaint/ Reason for visit-discuss final pathology results and further management  Heme/Onc history: Patient is a 66 year old female who underwent a screening bilateral mammogram in May 2023 which was followed by diagnostic mammogram.  Picked up a 2 cm group of amorphous calcifications in the left upper outer quadrant of the breast for which biopsy was recommended.  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  ER more than 90% positive.  Patient had a lumpectomy on 05/05/2022.  Final pathology showed a grade two 8 mm DCIS with negative margins.  Interval history-she is healing well after her lumpectomy and denies any specific complaints at this time  ECOG PS- 1 Pain scale- 0   Review of systems- Review of Systems  Constitutional:  Negative for chills, fever, malaise/fatigue and weight loss.  HENT:  Negative for congestion, ear discharge and nosebleeds.   Eyes:  Negative for blurred vision.  Respiratory:  Negative for cough, hemoptysis, sputum production, shortness of breath and wheezing.   Cardiovascular:  Negative for chest pain, palpitations, orthopnea and claudication.  Gastrointestinal:  Negative for abdominal pain, blood in stool, constipation, diarrhea, heartburn, melena, nausea and vomiting.  Genitourinary:  Negative for dysuria, flank pain, frequency, hematuria and urgency.  Musculoskeletal:  Negative for back pain, joint pain and myalgias.  Skin:  Negative for rash.  Neurological:  Negative for dizziness, tingling, focal weakness, seizures, weakness and headaches.  Endo/Heme/Allergies:  Does not  bruise/bleed easily.  Psychiatric/Behavioral:  Negative for depression and suicidal ideas. The patient does not have insomnia.       Allergies  Allergen Reactions   Latex Hives    Redness, itching   Pravastatin     Other reaction(s): Other (See Comments) Joint pain     Past Medical History:  Diagnosis Date   Breast cancer ([place])    GERD (gastroesophageal reflux disease)    Hypertension    Pre-diabetes      Past Surgical History:  Procedure Laterality Date   ABDOMINAL HYSTERECTOMY     BREAST LUMPECTOMY Left 05/05/2022   Procedure: BREAST LUMPECTOMY;  Surgeon: Robert Bellow, MD;  Location: [facility] ORS;  Service: General;  Laterality: Left;   COLONOSCOPY WITH PROPOFOL N/A 12/13/2017   Procedure: COLONOSCOPY WITH PROPOFOL;  Surgeon: Lollie Sails, MD;  Location: [facility] ENDOSCOPY;  Service: Endoscopy;  Laterality: N/A;    Social History   Socioeconomic History   Marital status: Married    Spouse name: Not on file   Number of children: Not on file   Years of education: Not on file   Highest education level: Not on file  Occupational History   Not on file  Tobacco Use   Smoking status: Never   Smokeless tobacco: Never  Vaping Use   Vaping Use: Never used  Substance and Sexual Activity   Alcohol use: Yes    Alcohol/week: 1.0 standard drink of alcohol    Types: 1 Glasses of wine per week    Comment: occasionaly   Drug use: No   Sexual activity: Yes  Other Topics Concern   Not on file  Social History Narrative   Not on file  Social Determinants of Health   Financial Resource Strain: Not on file  Food Insecurity: Not on file  Transportation Needs: Not on file  Physical Activity: Not on file  Stress: Not on file  Social Connections: Not on file  Intimate Partner Violence: Not on file    Family History  Problem Relation Age of Onset   Stroke Mother    Heart disease Mother    Prostate cancer Father    Heart disease Father    Testicular cancer Son     Breast cancer Neg Hx      Current Outpatient Medications:    aspirin EC 81 MG tablet, Take 81 mg by mouth daily., Disp: , Rfl:    carvedilol (COREG) 25 MG tablet, Take 25 mg by mouth 2 (two) times daily with a meal., Disp: , Rfl:    hydrochlorothiazide (HYDRODIURIL) 25 MG tablet, Take 25 mg by mouth daily., Disp: , Rfl:    letrozole (FEMARA) 2.5 MG tablet, Take 1 tablet (2.5 mg total) by mouth daily., Disp: 30 tablet, Rfl: 2  Physical exam:  Vitals:   05/23/22 1054  BP: (!) 104/59  Pulse: 60  Resp: 16  Temp: 98 F (36.7 C)  TempSrc: Oral  Weight: 212 lb (96.2 kg)  Height: '5\' 6"'  (1.676 m)   Physical Exam Cardiovascular:     Rate and Rhythm: Normal rate and regular rhythm.     Heart sounds: Normal heart sounds.  Pulmonary:     Effort: Pulmonary effort is normal.  Skin:    General: Skin is warm and dry.  Neurological:     Mental Status: She is alert and oriented to person, place, and time.         Latest Ref Rng & Units 05/05/2022    6:47 AM  CMP  Glucose 70 - 99 mg/dL 111   BUN 8 - 23 mg/dL 13   Creatinine 0.44 - 1.00 mg/dL 0.80   Sodium 135 - 145 mmol/L 142   Potassium 3.5 - 5.1 mmol/L 3.6   Chloride 98 - 111 mmol/L 105       Latest Ref Rng & Units 05/05/2022    6:47 AM  CBC  Hemoglobin 12.0 - 15.0 g/dL 14.3   Hematocrit 36.0 - 46.0 % 42.0     No images are attached to the encounter.  MM BREAST SURGICAL SPECIMEN  Result Date: 05/08/2022 CLINICAL DATA:  Status post lumpectomy. EXAM: SPECIMEN RADIOGRAPH OF THE LEFT BREAST COMPARISON:  Previous exams including stereotactic biopsy performed on 04/19/2022. FINDINGS: Status post excision of the left breast. The biopsy marker clip is present within the specimen at position I 9. IMPRESSION: Specimen radiograph of the left breast. Electronically Signed   By: Franki Cabot M.D.   On: 05/08/2022 08:08    Assessment and plan- Patient is a 66 y.o. female with new diagnosis of left breast DCIS here for discussion of final  pathology results and further management  Final pathology showed 8 mm grade 2 DCIS with negative margins.  No evidence of invasive mammary carcinoma seen in the specimen.  DCIS was ER positive.  I therefore recommend that she should proceed with adjuvant radiation treatment at this time.  I would recommend 5 years of endocrine therapy for her.  Baseline bone density scan is normal.  I will send letrozole prescription to her pharmacy which she will take along with calcium 1200 mg and vitamin D 800 international units but she will start this after radiation treatment is over.  Discussed risks and benefits of letrozole including all but not limited to hot flashes mood swings arthralgias and potential worsening bone health.  Treatment will be given with a curative intent.  Patient understands and agrees to proceed as planned.  I will tentatively see her back in 3-1/2 months that labs     Cancer Staging  Ductal carcinoma in situ (DCIS) of left breast Staging form: Breast, AJCC 8th Edition - Pathologic stage from 05/23/2022: Stage Unknown (pTis (DCIS), pNX, cM0, G2, ER+, PR: Unknown, HER2: Unknown) - Signed by Sindy Guadeloupe, MD on 05/23/2022 Stage prefix: Initial diagnosis Nuclear grade: G2 Multigene prognostic tests performed: None Histologic grading system: 3 grade system   Visit Diagnosis 1. Ductal carcinoma in situ (DCIS) of left breast   2. Goals of care, counseling/discussion      Dr. Randa Evens, MD, MPH [facility] [phone] 05/23/2022 12:26 PM

## 2022-05-23 NOTE — Progress Notes (Signed)
Met with patient at radiation consult.   Patient doing well, no concerns at this time.

## 2022-05-27 ENCOUNTER — Other Ambulatory Visit: Payer: Self-pay | Admitting: Oncology

## 2022-05-30 ENCOUNTER — Encounter: Payer: Self-pay | Admitting: *Deleted

## 2022-05-30 ENCOUNTER — Ambulatory Visit
Admission: RE | Admit: 2022-05-30 | Discharge: 2022-05-30 | Disposition: A | Discharge status: Home / Self Care | Payer: BC Managed Care – PPO | Source: Ambulatory Visit | Attending: Radiation Oncology | Admitting: Radiation Oncology

## 2022-05-30 DIAGNOSIS — Z510 Encounter for antineoplastic radiation therapy: Secondary | ICD-10-CM | POA: Insufficient documentation

## 2022-05-30 DIAGNOSIS — Z170 Estrogen receptor positive status [ER+]: Secondary | ICD-10-CM | POA: Insufficient documentation

## 2022-05-30 DIAGNOSIS — Z171 Estrogen receptor negative status [ER-]: Secondary | ICD-10-CM | POA: Insufficient documentation

## 2022-05-30 DIAGNOSIS — D0512 Intraductal carcinoma in situ of left breast: Secondary | ICD-10-CM | POA: Insufficient documentation

## 2022-05-30 DIAGNOSIS — C50412 Malignant neoplasm of upper-outer quadrant of left female breast: Secondary | ICD-10-CM | POA: Insufficient documentation

## 2022-06-02 ENCOUNTER — Other Ambulatory Visit: Payer: Self-pay | Admitting: *Deleted

## 2022-06-02 DIAGNOSIS — Z510 Encounter for antineoplastic radiation therapy: Secondary | ICD-10-CM | POA: Diagnosis not present

## 2022-06-02 DIAGNOSIS — D0512 Intraductal carcinoma in situ of left breast: Secondary | ICD-10-CM

## 2022-06-06 ENCOUNTER — Ambulatory Visit
Admission: RE | Admit: 2022-06-06 | Discharge: 2022-06-06 | Disposition: A | Discharge status: Home / Self Care | Payer: BC Managed Care – PPO | Source: Ambulatory Visit | Attending: Radiation Oncology | Admitting: Radiation Oncology

## 2022-06-06 DIAGNOSIS — Z510 Encounter for antineoplastic radiation therapy: Secondary | ICD-10-CM | POA: Diagnosis not present

## 2022-06-07 ENCOUNTER — Other Ambulatory Visit: Payer: Self-pay

## 2022-06-07 ENCOUNTER — Ambulatory Visit
Admission: RE | Admit: 2022-06-07 | Discharge: 2022-06-07 | Disposition: A | Discharge status: Home / Self Care | Payer: BC Managed Care – PPO | Source: Ambulatory Visit | Attending: Radiation Oncology | Admitting: Radiation Oncology

## 2022-06-07 DIAGNOSIS — Z510 Encounter for antineoplastic radiation therapy: Secondary | ICD-10-CM | POA: Diagnosis not present

## 2022-06-07 LAB — RAD ONC ARIA SESSION SUMMARY
Course Elapsed Days: 0
Plan Fractions Treated to Date: 1
Plan Prescribed Dose Per Fraction: 2.66 Gy
Plan Total Fractions Prescribed: 16
Plan Total Prescribed Dose: 42.56 Gy
Reference Point Dosage Given to Date: 2.66 Gy
Reference Point Session Dosage Given: 2.66 Gy
Session Number: 1

## 2022-06-08 ENCOUNTER — Ambulatory Visit
Admission: RE | Admit: 2022-06-08 | Discharge: 2022-06-08 | Disposition: A | Discharge status: Home / Self Care | Payer: BC Managed Care – PPO | Source: Ambulatory Visit | Attending: Radiation Oncology | Admitting: Radiation Oncology

## 2022-06-08 ENCOUNTER — Other Ambulatory Visit: Payer: Self-pay

## 2022-06-08 DIAGNOSIS — Z510 Encounter for antineoplastic radiation therapy: Secondary | ICD-10-CM | POA: Diagnosis not present

## 2022-06-08 LAB — RAD ONC ARIA SESSION SUMMARY
Course Elapsed Days: 1
Plan Fractions Treated to Date: 2
Plan Prescribed Dose Per Fraction: 2.66 Gy
Plan Total Fractions Prescribed: 16
Plan Total Prescribed Dose: 42.56 Gy
Reference Point Dosage Given to Date: 5.32 Gy
Reference Point Session Dosage Given: 2.66 Gy
Session Number: 2

## 2022-06-09 ENCOUNTER — Other Ambulatory Visit: Payer: Self-pay

## 2022-06-09 ENCOUNTER — Ambulatory Visit
Admission: RE | Admit: 2022-06-09 | Discharge: 2022-06-09 | Disposition: A | Discharge status: Home / Self Care | Payer: BC Managed Care – PPO | Source: Ambulatory Visit | Attending: Radiation Oncology | Admitting: Radiation Oncology

## 2022-06-09 DIAGNOSIS — Z510 Encounter for antineoplastic radiation therapy: Secondary | ICD-10-CM | POA: Diagnosis not present

## 2022-06-09 LAB — RAD ONC ARIA SESSION SUMMARY
Course Elapsed Days: 2
Plan Fractions Treated to Date: 3
Plan Prescribed Dose Per Fraction: 2.66 Gy
Plan Total Fractions Prescribed: 16
Plan Total Prescribed Dose: 42.56 Gy
Reference Point Dosage Given to Date: 7.98 Gy
Reference Point Session Dosage Given: 2.66 Gy
Session Number: 3

## 2022-06-12 ENCOUNTER — Ambulatory Visit
Admission: RE | Admit: 2022-06-12 | Discharge: 2022-06-12 | Disposition: A | Discharge status: Home / Self Care | Payer: BC Managed Care – PPO | Source: Ambulatory Visit | Attending: Radiation Oncology | Admitting: Radiation Oncology

## 2022-06-12 ENCOUNTER — Other Ambulatory Visit: Payer: Self-pay

## 2022-06-12 DIAGNOSIS — Z510 Encounter for antineoplastic radiation therapy: Secondary | ICD-10-CM | POA: Diagnosis not present

## 2022-06-12 LAB — RAD ONC ARIA SESSION SUMMARY
Course Elapsed Days: 5
Plan Fractions Treated to Date: 4
Plan Prescribed Dose Per Fraction: 2.66 Gy
Plan Total Fractions Prescribed: 16
Plan Total Prescribed Dose: 42.56 Gy
Reference Point Dosage Given to Date: 10.64 Gy
Reference Point Session Dosage Given: 2.66 Gy
Session Number: 4

## 2022-06-13 ENCOUNTER — Other Ambulatory Visit: Payer: Self-pay

## 2022-06-13 ENCOUNTER — Ambulatory Visit
Admission: RE | Admit: 2022-06-13 | Discharge: 2022-06-13 | Disposition: A | Discharge status: Home / Self Care | Payer: BC Managed Care – PPO | Source: Ambulatory Visit | Attending: Radiation Oncology | Admitting: Radiation Oncology

## 2022-06-13 DIAGNOSIS — Z510 Encounter for antineoplastic radiation therapy: Secondary | ICD-10-CM | POA: Diagnosis not present

## 2022-06-13 LAB — RAD ONC ARIA SESSION SUMMARY
Course Elapsed Days: 6
Plan Fractions Treated to Date: 5
Plan Prescribed Dose Per Fraction: 2.66 Gy
Plan Total Fractions Prescribed: 16
Plan Total Prescribed Dose: 42.56 Gy
Reference Point Dosage Given to Date: 13.3 Gy
Reference Point Session Dosage Given: 2.66 Gy
Session Number: 5

## 2022-06-14 ENCOUNTER — Ambulatory Visit
Admission: RE | Admit: 2022-06-14 | Discharge: 2022-06-14 | Disposition: A | Discharge status: Home / Self Care | Payer: BC Managed Care – PPO | Source: Ambulatory Visit | Attending: Radiation Oncology | Admitting: Radiation Oncology

## 2022-06-14 ENCOUNTER — Other Ambulatory Visit: Payer: Self-pay

## 2022-06-14 DIAGNOSIS — Z510 Encounter for antineoplastic radiation therapy: Secondary | ICD-10-CM | POA: Diagnosis not present

## 2022-06-14 LAB — RAD ONC ARIA SESSION SUMMARY
Course Elapsed Days: 7
Plan Fractions Treated to Date: 6
Plan Prescribed Dose Per Fraction: 2.66 Gy
Plan Total Fractions Prescribed: 16
Plan Total Prescribed Dose: 42.56 Gy
Reference Point Dosage Given to Date: 15.96 Gy
Reference Point Session Dosage Given: 2.66 Gy
Session Number: 6

## 2022-06-15 ENCOUNTER — Ambulatory Visit
Admission: RE | Admit: 2022-06-15 | Discharge: 2022-06-15 | Disposition: A | Discharge status: Home / Self Care | Payer: BC Managed Care – PPO | Source: Ambulatory Visit | Attending: Radiation Oncology | Admitting: Radiation Oncology

## 2022-06-15 ENCOUNTER — Other Ambulatory Visit: Payer: Self-pay

## 2022-06-15 DIAGNOSIS — Z510 Encounter for antineoplastic radiation therapy: Secondary | ICD-10-CM | POA: Diagnosis not present

## 2022-06-15 LAB — RAD ONC ARIA SESSION SUMMARY
Course Elapsed Days: 8
Plan Fractions Treated to Date: 7
Plan Prescribed Dose Per Fraction: 2.66 Gy
Plan Total Fractions Prescribed: 16
Plan Total Prescribed Dose: 42.56 Gy
Reference Point Dosage Given to Date: 18.62 Gy
Reference Point Session Dosage Given: 2.66 Gy
Session Number: 7

## 2022-06-16 ENCOUNTER — Other Ambulatory Visit: Payer: Self-pay

## 2022-06-16 ENCOUNTER — Ambulatory Visit
Admission: RE | Admit: 2022-06-16 | Discharge: 2022-06-16 | Disposition: A | Discharge status: Home / Self Care | Payer: BC Managed Care – PPO | Source: Ambulatory Visit | Attending: Radiation Oncology | Admitting: Radiation Oncology

## 2022-06-16 DIAGNOSIS — Z510 Encounter for antineoplastic radiation therapy: Secondary | ICD-10-CM | POA: Diagnosis not present

## 2022-06-16 LAB — RAD ONC ARIA SESSION SUMMARY
Course Elapsed Days: 9
Plan Fractions Treated to Date: 8
Plan Prescribed Dose Per Fraction: 2.66 Gy
Plan Total Fractions Prescribed: 16
Plan Total Prescribed Dose: 42.56 Gy
Reference Point Dosage Given to Date: 21.28 Gy
Reference Point Session Dosage Given: 2.66 Gy
Session Number: 8

## 2022-06-19 ENCOUNTER — Other Ambulatory Visit: Payer: Self-pay

## 2022-06-19 ENCOUNTER — Ambulatory Visit
Admission: RE | Admit: 2022-06-19 | Discharge: 2022-06-19 | Disposition: A | Discharge status: Home / Self Care | Payer: BC Managed Care – PPO | Source: Ambulatory Visit | Attending: Radiation Oncology | Admitting: Radiation Oncology

## 2022-06-19 DIAGNOSIS — Z510 Encounter for antineoplastic radiation therapy: Secondary | ICD-10-CM | POA: Diagnosis not present

## 2022-06-19 LAB — RAD ONC ARIA SESSION SUMMARY
Course Elapsed Days: 12
Plan Fractions Treated to Date: 9
Plan Prescribed Dose Per Fraction: 2.66 Gy
Plan Total Fractions Prescribed: 16
Plan Total Prescribed Dose: 42.56 Gy
Reference Point Dosage Given to Date: 23.94 Gy
Reference Point Session Dosage Given: 2.66 Gy
Session Number: 9

## 2022-06-20 ENCOUNTER — Ambulatory Visit
Admission: RE | Admit: 2022-06-20 | Discharge: 2022-06-20 | Disposition: A | Discharge status: Home / Self Care | Payer: BC Managed Care – PPO | Source: Ambulatory Visit | Attending: Radiation Oncology | Admitting: Radiation Oncology

## 2022-06-20 ENCOUNTER — Inpatient Hospital Stay: Payer: BC Managed Care – PPO | Attending: Oncology

## 2022-06-20 ENCOUNTER — Other Ambulatory Visit: Payer: Self-pay

## 2022-06-20 DIAGNOSIS — D0512 Intraductal carcinoma in situ of left breast: Secondary | ICD-10-CM

## 2022-06-20 DIAGNOSIS — Z510 Encounter for antineoplastic radiation therapy: Secondary | ICD-10-CM | POA: Diagnosis not present

## 2022-06-20 LAB — CBC
HCT: 40.7 % (ref 36.0–46.0)
Hemoglobin: 13.2 g/dL (ref 12.0–15.0)
MCH: 27.5 pg (ref 26.0–34.0)
MCHC: 32.4 g/dL (ref 30.0–36.0)
MCV: 84.8 fL (ref 80.0–100.0)
Platelets: 231 10*3/uL (ref 150–400)
RBC: 4.8 MIL/uL (ref 3.87–5.11)
RDW: 14.2 % (ref 11.5–15.5)
WBC: 6.4 10*3/uL (ref 4.0–10.5)
nRBC: 0 % (ref 0.0–0.2)

## 2022-06-20 LAB — RAD ONC ARIA SESSION SUMMARY
Course Elapsed Days: 13
Plan Fractions Treated to Date: 10
Plan Prescribed Dose Per Fraction: 2.66 Gy
Plan Total Fractions Prescribed: 16
Plan Total Prescribed Dose: 42.56 Gy
Reference Point Dosage Given to Date: 26.6 Gy
Reference Point Session Dosage Given: 2.66 Gy
Session Number: 10

## 2022-06-21 ENCOUNTER — Other Ambulatory Visit: Payer: Self-pay

## 2022-06-21 ENCOUNTER — Ambulatory Visit
Admission: RE | Admit: 2022-06-21 | Discharge: 2022-06-21 | Disposition: A | Discharge status: Home / Self Care | Payer: BC Managed Care – PPO | Source: Ambulatory Visit | Attending: Radiation Oncology | Admitting: Radiation Oncology

## 2022-06-21 DIAGNOSIS — Z510 Encounter for antineoplastic radiation therapy: Secondary | ICD-10-CM | POA: Diagnosis not present

## 2022-06-21 LAB — RAD ONC ARIA SESSION SUMMARY
Course Elapsed Days: 14
Plan Fractions Treated to Date: 11
Plan Prescribed Dose Per Fraction: 2.66 Gy
Plan Total Fractions Prescribed: 16
Plan Total Prescribed Dose: 42.56 Gy
Reference Point Dosage Given to Date: 29.26 Gy
Reference Point Session Dosage Given: 2.66 Gy
Session Number: 11

## 2022-06-22 ENCOUNTER — Ambulatory Visit
Admission: RE | Admit: 2022-06-22 | Discharge: 2022-06-22 | Disposition: A | Discharge status: Home / Self Care | Payer: BC Managed Care – PPO | Source: Ambulatory Visit | Attending: Radiation Oncology | Admitting: Radiation Oncology

## 2022-06-22 ENCOUNTER — Other Ambulatory Visit: Payer: Self-pay

## 2022-06-22 DIAGNOSIS — Z510 Encounter for antineoplastic radiation therapy: Secondary | ICD-10-CM | POA: Diagnosis not present

## 2022-06-22 LAB — RAD ONC ARIA SESSION SUMMARY
Course Elapsed Days: 15
Plan Fractions Treated to Date: 12
Plan Prescribed Dose Per Fraction: 2.66 Gy
Plan Total Fractions Prescribed: 16
Plan Total Prescribed Dose: 42.56 Gy
Reference Point Dosage Given to Date: 31.92 Gy
Reference Point Session Dosage Given: 2.66 Gy
Session Number: 12

## 2022-06-23 ENCOUNTER — Other Ambulatory Visit: Payer: Self-pay

## 2022-06-23 ENCOUNTER — Ambulatory Visit
Admission: RE | Admit: 2022-06-23 | Discharge: 2022-06-23 | Disposition: A | Discharge status: Home / Self Care | Payer: BC Managed Care – PPO | Source: Ambulatory Visit | Attending: Radiation Oncology | Admitting: Radiation Oncology

## 2022-06-23 DIAGNOSIS — Z510 Encounter for antineoplastic radiation therapy: Secondary | ICD-10-CM | POA: Diagnosis not present

## 2022-06-23 LAB — RAD ONC ARIA SESSION SUMMARY
Course Elapsed Days: 16
Plan Fractions Treated to Date: 13
Plan Prescribed Dose Per Fraction: 2.66 Gy
Plan Total Fractions Prescribed: 16
Plan Total Prescribed Dose: 42.56 Gy
Reference Point Dosage Given to Date: 34.58 Gy
Reference Point Session Dosage Given: 2.66 Gy
Session Number: 13

## 2022-06-26 ENCOUNTER — Ambulatory Visit
Admission: RE | Admit: 2022-06-26 | Discharge: 2022-06-26 | Disposition: A | Discharge status: Home / Self Care | Payer: BC Managed Care – PPO | Source: Ambulatory Visit | Attending: Radiation Oncology | Admitting: Radiation Oncology

## 2022-06-26 ENCOUNTER — Other Ambulatory Visit: Payer: Self-pay

## 2022-06-26 DIAGNOSIS — Z510 Encounter for antineoplastic radiation therapy: Secondary | ICD-10-CM | POA: Diagnosis not present

## 2022-06-26 LAB — RAD ONC ARIA SESSION SUMMARY
Course Elapsed Days: 19
Plan Fractions Treated to Date: 14
Plan Prescribed Dose Per Fraction: 2.66 Gy
Plan Total Fractions Prescribed: 16
Plan Total Prescribed Dose: 42.56 Gy
Reference Point Dosage Given to Date: 37.24 Gy
Reference Point Session Dosage Given: 2.66 Gy
Session Number: 14

## 2022-06-27 ENCOUNTER — Ambulatory Visit
Admission: RE | Admit: 2022-06-27 | Discharge: 2022-06-27 | Disposition: A | Discharge status: Home / Self Care | Payer: BC Managed Care – PPO | Source: Ambulatory Visit | Attending: Radiation Oncology | Admitting: Radiation Oncology

## 2022-06-27 ENCOUNTER — Other Ambulatory Visit: Payer: Self-pay

## 2022-06-27 DIAGNOSIS — Z510 Encounter for antineoplastic radiation therapy: Secondary | ICD-10-CM | POA: Diagnosis not present

## 2022-06-27 LAB — RAD ONC ARIA SESSION SUMMARY
Course Elapsed Days: 20
Plan Fractions Treated to Date: 15
Plan Prescribed Dose Per Fraction: 2.66 Gy
Plan Total Fractions Prescribed: 16
Plan Total Prescribed Dose: 42.56 Gy
Reference Point Dosage Given to Date: 39.9 Gy
Reference Point Session Dosage Given: 2.66 Gy
Session Number: 15

## 2022-06-28 ENCOUNTER — Other Ambulatory Visit: Payer: Self-pay

## 2022-06-28 ENCOUNTER — Ambulatory Visit
Admission: RE | Admit: 2022-06-28 | Discharge: 2022-06-28 | Disposition: A | Discharge status: Home / Self Care | Payer: BC Managed Care – PPO | Source: Ambulatory Visit | Attending: Radiation Oncology | Admitting: Radiation Oncology

## 2022-06-28 DIAGNOSIS — Z510 Encounter for antineoplastic radiation therapy: Secondary | ICD-10-CM | POA: Diagnosis not present

## 2022-06-28 LAB — RAD ONC ARIA SESSION SUMMARY
Course Elapsed Days: 21
Plan Fractions Treated to Date: 16
Plan Prescribed Dose Per Fraction: 2.66 Gy
Plan Total Fractions Prescribed: 16
Plan Total Prescribed Dose: 42.56 Gy
Reference Point Dosage Given to Date: 42.56 Gy
Reference Point Session Dosage Given: 2.66 Gy
Session Number: 16

## 2022-06-29 ENCOUNTER — Other Ambulatory Visit: Payer: Self-pay

## 2022-06-29 ENCOUNTER — Other Ambulatory Visit: Payer: BC Managed Care – PPO

## 2022-06-29 ENCOUNTER — Ambulatory Visit
Admission: RE | Admit: 2022-06-29 | Discharge: 2022-06-29 | Disposition: A | Discharge status: Home / Self Care | Payer: BC Managed Care – PPO | Source: Ambulatory Visit | Attending: Radiation Oncology | Admitting: Radiation Oncology

## 2022-06-29 DIAGNOSIS — Z510 Encounter for antineoplastic radiation therapy: Secondary | ICD-10-CM | POA: Diagnosis not present

## 2022-06-29 LAB — RAD ONC ARIA SESSION SUMMARY
Course Elapsed Days: 22
Plan Fractions Treated to Date: 1
Plan Prescribed Dose Per Fraction: 2 Gy
Plan Total Fractions Prescribed: 5
Plan Total Prescribed Dose: 10 Gy
Reference Point Dosage Given to Date: 2 Gy
Reference Point Session Dosage Given: 2 Gy
Session Number: 17

## 2022-06-30 ENCOUNTER — Ambulatory Visit
Admission: RE | Admit: 2022-06-30 | Discharge: 2022-06-30 | Disposition: A | Discharge status: Home / Self Care | Payer: BC Managed Care – PPO | Source: Ambulatory Visit | Attending: Radiation Oncology | Admitting: Radiation Oncology

## 2022-06-30 ENCOUNTER — Other Ambulatory Visit: Payer: Self-pay

## 2022-06-30 DIAGNOSIS — Z79811 Long term (current) use of aromatase inhibitors: Secondary | ICD-10-CM | POA: Diagnosis not present

## 2022-06-30 DIAGNOSIS — Z510 Encounter for antineoplastic radiation therapy: Secondary | ICD-10-CM | POA: Insufficient documentation

## 2022-06-30 DIAGNOSIS — D0512 Intraductal carcinoma in situ of left breast: Secondary | ICD-10-CM | POA: Diagnosis not present

## 2022-06-30 DIAGNOSIS — Z170 Estrogen receptor positive status [ER+]: Secondary | ICD-10-CM | POA: Insufficient documentation

## 2022-06-30 LAB — RAD ONC ARIA SESSION SUMMARY
Course Elapsed Days: 23
Plan Fractions Treated to Date: 2
Plan Prescribed Dose Per Fraction: 2 Gy
Plan Total Fractions Prescribed: 5
Plan Total Prescribed Dose: 10 Gy
Reference Point Dosage Given to Date: 4 Gy
Reference Point Session Dosage Given: 2 Gy
Session Number: 18

## 2022-07-04 ENCOUNTER — Ambulatory Visit
Admission: RE | Admit: 2022-07-04 | Discharge: 2022-07-04 | Disposition: A | Discharge status: Home / Self Care | Payer: BC Managed Care – PPO | Source: Ambulatory Visit | Attending: Radiation Oncology | Admitting: Radiation Oncology

## 2022-07-04 ENCOUNTER — Encounter: Payer: Self-pay | Admitting: General Surgery

## 2022-07-04 ENCOUNTER — Other Ambulatory Visit: Payer: Self-pay

## 2022-07-04 ENCOUNTER — Inpatient Hospital Stay: Payer: BC Managed Care – PPO

## 2022-07-04 DIAGNOSIS — D0512 Intraductal carcinoma in situ of left breast: Secondary | ICD-10-CM | POA: Insufficient documentation

## 2022-07-04 DIAGNOSIS — Z79811 Long term (current) use of aromatase inhibitors: Secondary | ICD-10-CM | POA: Insufficient documentation

## 2022-07-04 DIAGNOSIS — Z170 Estrogen receptor positive status [ER+]: Secondary | ICD-10-CM | POA: Insufficient documentation

## 2022-07-04 DIAGNOSIS — Z510 Encounter for antineoplastic radiation therapy: Secondary | ICD-10-CM | POA: Diagnosis not present

## 2022-07-04 LAB — RAD ONC ARIA SESSION SUMMARY
Course Elapsed Days: 27
Plan Fractions Treated to Date: 3
Plan Prescribed Dose Per Fraction: 2 Gy
Plan Total Fractions Prescribed: 5
Plan Total Prescribed Dose: 10 Gy
Reference Point Dosage Given to Date: 6 Gy
Reference Point Session Dosage Given: 2 Gy
Session Number: 19

## 2022-07-04 LAB — CBC
HCT: 42.6 % (ref 36.0–46.0)
Hemoglobin: 13.6 g/dL (ref 12.0–15.0)
MCH: 27 pg (ref 26.0–34.0)
MCHC: 31.9 g/dL (ref 30.0–36.0)
MCV: 84.7 fL (ref 80.0–100.0)
Platelets: 229 10*3/uL (ref 150–400)
RBC: 5.03 MIL/uL (ref 3.87–5.11)
RDW: 14.4 % (ref 11.5–15.5)
WBC: 6.8 10*3/uL (ref 4.0–10.5)
nRBC: 0 % (ref 0.0–0.2)

## 2022-07-04 NOTE — Anesthesia Preprocedure Evaluation (Signed)
Anesthesia Evaluation  Patient identified by MRN, date of birth, ID band Patient awake    Reviewed: Allergy & Precautions, NPO status , Patient's Chart, lab work & pertinent test results, reviewed documented beta blocker date and time   History of Anesthesia Complications Negative for: history of anesthetic complications  Airway Mallampati: II   Neck ROM: Full    Dental no notable dental hx.    Pulmonary neg pulmonary ROS,    Pulmonary exam normal breath sounds clear to auscultation       Cardiovascular Pt. on medications and Pt. on home beta blockers Normal cardiovascular exam Rhythm:Regular Rate:Normal  ECG 05/03/22:  Sinus bradycardia Left axis deviation Left ventricular hypertrophy with QRS widening ( R in aVL , Cornell product ) Cannot rule out Septal infarct (cited on or before 28-Jan-2015)   Neuro/Psych negative neurological ROS     GI/Hepatic Neg liver ROS, GERD  Controlled,  Endo/Other  Prediabetes, obesity  Renal/GU negative Renal ROS     Musculoskeletal   Abdominal   Peds  Hematology negative hematology ROS (+) Breast CA   Anesthesia Other Findings   Reproductive/Obstetrics                            Anesthesia Physical  Anesthesia Plan  ASA: 2  Anesthesia Plan: General   Post-op Pain Management:    Induction: Intravenous  PONV Risk Score and Plan: 3 and Treatment may vary due to age or medical condition and Propofol infusion  Airway Management Planned: Natural Airway  Additional Equipment:   Intra-op Plan:   Post-operative Plan:   Informed Consent: I have reviewed the patients History and Physical, chart, labs and discussed the procedure including the risks, benefits and alternatives for the proposed anesthesia with the patient or authorized representative who has indicated his/her understanding and acceptance.     Dental advisory given  Plan Discussed  with: CRNA  Anesthesia Plan Comments:        Anesthesia Quick Evaluation

## 2022-07-05 ENCOUNTER — Ambulatory Visit: Payer: BC Managed Care – PPO | Admitting: Anesthesiology

## 2022-07-05 ENCOUNTER — Ambulatory Visit
Admission: RE | Admit: 2022-07-05 | Discharge: 2022-07-05 | Disposition: A | Discharge status: Home / Self Care | Payer: BC Managed Care – PPO | Attending: General Surgery | Admitting: General Surgery

## 2022-07-05 ENCOUNTER — Ambulatory Visit: Payer: BC Managed Care – PPO

## 2022-07-05 ENCOUNTER — Encounter
Admission: RE | Disposition: A | Discharge status: Home / Self Care | Payer: Self-pay | Source: Home / Self Care | Attending: General Surgery

## 2022-07-05 DIAGNOSIS — K635 Polyp of colon: Secondary | ICD-10-CM | POA: Diagnosis not present

## 2022-07-05 DIAGNOSIS — Z09 Encounter for follow-up examination after completed treatment for conditions other than malignant neoplasm: Secondary | ICD-10-CM | POA: Insufficient documentation

## 2022-07-05 HISTORY — PX: COLONOSCOPY WITH PROPOFOL: SHX5780

## 2022-07-05 SURGERY — COLONOSCOPY WITH PROPOFOL
Anesthesia: General

## 2022-07-05 MED ORDER — PROPOFOL 500 MG/50ML IV EMUL
INTRAVENOUS | Status: DC | PRN
  Administered 2022-07-05: 175 ug/kg/min via INTRAVENOUS

## 2022-07-05 MED ORDER — LIDOCAINE HCL (PF) 2 % IJ SOLN
INTRAMUSCULAR | Status: AC
  Filled 2022-07-05: qty 15

## 2022-07-05 MED ORDER — SODIUM CHLORIDE 0.9 % IV SOLN
INTRAVENOUS | Status: DC
  Administered 2022-07-05: 20 mL/h via INTRAVENOUS

## 2022-07-05 MED ORDER — EPHEDRINE SULFATE (PRESSORS) 50 MG/ML IJ SOLN
INTRAMUSCULAR | Status: DC | PRN
  Administered 2022-07-05: 10 mg via INTRAVENOUS
  Administered 2022-07-05: 5 mg via INTRAVENOUS

## 2022-07-05 MED ORDER — PROPOFOL 1000 MG/100ML IV EMUL
INTRAVENOUS | Status: AC
  Filled 2022-07-05: qty 200

## 2022-07-05 MED ORDER — LIDOCAINE HCL (CARDIAC) PF 100 MG/5ML IV SOSY
PREFILLED_SYRINGE | INTRAVENOUS | Status: DC | PRN
  Administered 2022-07-05: 100 mg via INTRAVENOUS

## 2022-07-05 MED ORDER — PROPOFOL 10 MG/ML IV BOLUS
INTRAVENOUS | Status: DC | PRN
  Administered 2022-07-05: 10 mg via INTRAVENOUS
  Administered 2022-07-05: 90 mg via INTRAVENOUS

## 2022-07-05 NOTE — H&P (Signed)
Kristine Rose [id number] 1955/11/07     HPI: 66 y/o with past history of colon polyps. SSA of the cecum  in 2019. For follow up exam.   Medications Prior to Admission  Medication Sig Dispense Refill Last Dose   aspirin EC 81 MG tablet Take 81 mg by mouth daily.   07/04/2022   carvedilol (COREG) 25 MG tablet Take 25 mg by mouth 2 (two) times daily with a meal.   07/04/2022   hydrochlorothiazide (HYDRODIURIL) 25 MG tablet Take 25 mg by mouth daily.   07/04/2022   letrozole (FEMARA) 2.5 MG tablet Take 1 tablet (2.5 mg total) by mouth daily. 30 tablet 2 07/04/2022   Allergies  Allergen Reactions   Latex Hives    Redness, itching   Pravastatin     Other reaction(s): Other (See Comments) Joint pain   Past Medical History:  Diagnosis Date   Breast cancer ([place])    GERD (gastroesophageal reflux disease)    Hypertension    Pre-diabetes    Past Surgical History:  Procedure Laterality Date   ABDOMINAL HYSTERECTOMY     BREAST LUMPECTOMY Left 05/05/2022   Procedure: BREAST LUMPECTOMY;  Surgeon: Robert Bellow, MD;  Location: [facility] ORS;  Service: General;  Laterality: Left;   COLONOSCOPY WITH PROPOFOL N/A 12/13/2017   Procedure: COLONOSCOPY WITH PROPOFOL;  Surgeon: Lollie Sails, MD;  Location: [facility] ENDOSCOPY;  Service: Endoscopy;  Laterality: N/A;   Social History   Socioeconomic History   Marital status: Married    Spouse name: Not on file   Number of children: Not on file   Years of education: Not on file   Highest education level: Not on file  Occupational History   Not on file  Tobacco Use   Smoking status: Never   Smokeless tobacco: Never  Vaping Use   Vaping Use: Never used  Substance and Sexual Activity   Alcohol use: Yes    Alcohol/week: 1.0 standard drink of alcohol    Types: 1 Glasses of wine per week    Comment: occasionaly   Drug use: No   Sexual activity: Yes  Other Topics Concern   Not on file  Social History Narrative   Not on file   Social  Determinants of Health   Financial Resource Strain: Not on file  Food Insecurity: Not on file  Transportation Needs: Not on file  Physical Activity: Not on file  Stress: Not on file  Social Connections: Not on file  Intimate Partner Violence: Not on file   Social History   Social History Narrative   Not on file     ROS: Negative.     PE: HEENT: Negative. Lungs: Clear. Cardio: RR.   Assessment/Plan:  Proceed with planned endoscopy.  Forest Gleason [facility] 07/05/2022

## 2022-07-05 NOTE — Transfer of Care (Signed)
Immediate Anesthesia Transfer of Care Note  Patient: Kristine Rose  Procedure(s) Performed: COLONOSCOPY WITH PROPOFOL  Patient Location: Endoscopy Unit  Anesthesia Type:General  Level of Consciousness: drowsy  Airway & Oxygen Therapy: Patient Spontanous Breathing  Post-op Assessment: Report given to RN and Post -op Vital signs reviewed and stable  Post vital signs: Reviewed and stable  Last Vitals:  Vitals Value Taken Time  BP    Temp    Pulse    Resp    SpO2      Last Pain:  Vitals:   07/05/22 0808  TempSrc: Temporal  PainSc: 0-No pain         Complications: No notable events documented.

## 2022-07-05 NOTE — Op Note (Signed)
[facility] Gastroenterology Patient Name: Kristine Rose Procedure Date: 07/05/2022 8:33 AM MRN: [id number] Account #: [id number] Date of Birth: [birth year] Admit Type: Outpatient Age: 66 Room: [facility] ENDO ROOM 1 Gender: Female Note Status: Finalized Instrument Name: Peds Colonoscope [id number] Procedure:             Colonoscopy Indications:           High risk colon cancer surveillance: Personal history                         of colonic polyps Providers:             Robert Bellow, MD Referring MD:          Robert Bellow, MD (Referring MD) Medicines:             Propofol per Anesthesia Complications:         No immediate complications. Procedure:             Pre-Anesthesia Assessment:                        - Prior to the procedure, a History and Physical was                         performed, and patient medications, allergies and                         sensitivities were reviewed. The patient's tolerance                         of previous anesthesia was reviewed.                        - The risks and benefits of the procedure and the                         sedation options and risks were discussed with the                         patient. All questions were answered and informed                         consent was obtained.                        After obtaining informed consent, the colonoscope was                         passed under direct vision. Throughout the procedure,                         the patient's blood pressure, pulse, and oxygen                         saturations were monitored continuously. The                         Colonoscope was introduced through the anus and  advanced to the the cecum, identified by appendiceal                         orifice and ileocecal valve. The colonoscopy was                         performed with moderate difficulty due to restricted                         mobility of the  colon, significant looping and a                         tortuous colon. Successful completion of the procedure                         was aided by changing the patient to a prone position                         and using manual pressure. The patient tolerated the                         procedure well. The quality of the bowel preparation                         was excellent. Findings:      A 5 mm polyp was found in the cecum. The polyp was sessile. Biopsies       were taken with a cold forceps for histology.      A 9 mm polyp was found in the descending colon. The polyp was sessile.       The polyp was removed with a hot snare. Resection and retrieval were       complete.      The retroflexed view of the distal rectum and anal verge was normal and       showed no anal or rectal abnormalities. Impression:            - One 5 mm polyp in the cecum. Biopsied.                        - One 9 mm polyp in the descending colon, removed with                         a hot snare. Resected and retrieved.                        - The distal rectum and anal verge are normal on                         retroflexion view. Recommendation:        - Telephone endoscopist for pathology results in 1                         week. Procedure Code(s):     --- Professional ---                        [phone], Colonoscopy, flexible; with removal of  tumor(s), polyp(s), or other lesion(s) by snare                         technique                        45380, 34, Colonoscopy, flexible; with biopsy, single                         or multiple Diagnosis Code(s):     --- Professional ---                        Z86.010, Personal history of colonic polyps                        K63.5, Polyp of colon CPT copyright 2019 American Medical Association. All rights reserved. The codes documented in this report are preliminary and upon coder review may  be revised to meet current compliance  requirements. Robert Bellow, MD 07/05/2022 9:29:04 AM This report has been signed electronically. Number of Addenda: 0 Note Initiated On: 07/05/2022 8:33 AM Scope Withdrawal Time: 0 hours 18 minutes 30 seconds  Total Procedure Duration: 0 hours 43 minutes 33 seconds  Estimated Blood Loss:  Estimated blood loss was minimal.      [facility]

## 2022-07-05 NOTE — Anesthesia Postprocedure Evaluation (Signed)
Anesthesia Post Note  Patient: Kristine Rose  Procedure(s) Performed: COLONOSCOPY WITH PROPOFOL  Patient location during evaluation: PACU Anesthesia Type: General Level of consciousness: awake and alert Pain management: pain level controlled Vital Signs Assessment: post-procedure vital signs reviewed and stable Respiratory status: spontaneous breathing, nonlabored ventilation and respiratory function stable Cardiovascular status: blood pressure returned to baseline and stable Postop Assessment: no apparent nausea or vomiting Anesthetic complications: no   No notable events documented.   Last Vitals:  Vitals:   07/05/22 0950 07/05/22 1000  BP: 110/66 118/68  Pulse: 92 (!) 57  Resp: 20 18  Temp:    SpO2: 99% 96%    Last Pain:  Vitals:   07/05/22 1000  TempSrc:   PainSc: 0-No pain                 [place] Ouch

## 2022-07-06 ENCOUNTER — Other Ambulatory Visit: Payer: Self-pay

## 2022-07-06 ENCOUNTER — Encounter: Payer: Self-pay | Admitting: General Surgery

## 2022-07-06 ENCOUNTER — Ambulatory Visit
Admission: RE | Admit: 2022-07-06 | Discharge: 2022-07-06 | Disposition: A | Discharge status: Home / Self Care | Payer: BC Managed Care – PPO | Source: Ambulatory Visit | Attending: Radiation Oncology | Admitting: Radiation Oncology

## 2022-07-06 ENCOUNTER — Ambulatory Visit: Payer: BC Managed Care – PPO

## 2022-07-06 DIAGNOSIS — Z510 Encounter for antineoplastic radiation therapy: Secondary | ICD-10-CM | POA: Diagnosis not present

## 2022-07-06 LAB — RAD ONC ARIA SESSION SUMMARY
Course Elapsed Days: 29
Plan Fractions Treated to Date: 4
Plan Prescribed Dose Per Fraction: 2 Gy
Plan Total Fractions Prescribed: 5
Plan Total Prescribed Dose: 10 Gy
Reference Point Dosage Given to Date: 8 Gy
Reference Point Session Dosage Given: 2 Gy
Session Number: 20

## 2022-07-07 ENCOUNTER — Other Ambulatory Visit: Payer: Self-pay

## 2022-07-07 ENCOUNTER — Ambulatory Visit
Admission: RE | Admit: 2022-07-07 | Discharge: 2022-07-07 | Disposition: A | Discharge status: Home / Self Care | Payer: BC Managed Care – PPO | Source: Ambulatory Visit | Attending: Radiation Oncology | Admitting: Radiation Oncology

## 2022-07-07 DIAGNOSIS — Z510 Encounter for antineoplastic radiation therapy: Secondary | ICD-10-CM | POA: Diagnosis not present

## 2022-07-07 LAB — RAD ONC ARIA SESSION SUMMARY
Course Elapsed Days: 30
Plan Fractions Treated to Date: 5
Plan Prescribed Dose Per Fraction: 2 Gy
Plan Total Fractions Prescribed: 5
Plan Total Prescribed Dose: 10 Gy
Reference Point Dosage Given to Date: 10 Gy
Reference Point Session Dosage Given: 2 Gy
Session Number: 21

## 2022-07-07 LAB — SURGICAL PATHOLOGY

## 2022-07-10 ENCOUNTER — Encounter: Payer: Self-pay | Admitting: *Deleted

## 2022-07-24 ENCOUNTER — Telehealth: Payer: Self-pay | Admitting: Radiation Oncology

## 2022-07-24 ENCOUNTER — Telehealth: Payer: Self-pay | Admitting: Oncology

## 2022-07-24 NOTE — Telephone Encounter (Signed)
error 

## 2022-08-10 ENCOUNTER — Encounter: Payer: Self-pay | Admitting: Radiation Oncology

## 2022-08-10 ENCOUNTER — Ambulatory Visit
Admission: RE | Admit: 2022-08-10 | Discharge: 2022-08-10 | Disposition: A | Discharge status: Home / Self Care | Payer: BC Managed Care – PPO | Source: Ambulatory Visit | Attending: Radiation Oncology | Admitting: Radiation Oncology

## 2022-08-10 VITALS — BP 116/70 | HR 60 | Temp 96.0°F | Resp 16 | Ht 64.0 in | Wt 216.7 lb

## 2022-08-10 DIAGNOSIS — C50412 Malignant neoplasm of upper-outer quadrant of left female breast: Secondary | ICD-10-CM

## 2022-08-10 NOTE — Progress Notes (Signed)
Radiation Oncology Follow up Note  Name: Kristine Rose   Date:   08/10/2022 MRN:  [id number] DOB: 1956-08-29    This 66 y.o. 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  Seen today in routine follow-up she is doing well.  Specifically denies breast tenderness cough or bone pain..  She has been started on letrozole tolerating that well without side effect.  COMPLICATIONS OF TREATMENT: none  FOLLOW UP COMPLIANCE: keeps appointments   PHYSICAL EXAM:  BP 116/70   Pulse 60   Temp (!) 96 F (35.6 C)   Resp 16   Ht '5\' 4"'$  (1.626 m)   Wt 216 lb 11.2 oz (98.3 kg)   BMI 37.20 kg/m lungs are clear to A&P cardiac examination essentially unremarkable with regular rate and rhythm. No dominant mass or nodularity is noted in either breast in 2 positions examined. Incision is well-healed. No axillary or supraclavicular adenopathy is appreciated. Cosmetic result is excellent.  Still has some slight hyperpigmentation of the skin. Well-developed well-nourished patient in NAD. HEENT reveals PERLA, EOMI, discs not visualized.  Oral cavity is clear. No oral mucosal lesions are identified. Neck is clear without evidence of cervical or supraclavicular adenopathy. Lungs are clear to A&P. Cardiac examination is essentially unremarkable with regular rate and rhythm without murmur rub or thrill. Abdomen is benign with no organomegaly or masses noted. Motor sensory and DTR levels are equal and symmetric in the upper and lower extremities. Cranial nerves II through XII are grossly intact. Proprioception is intact. No peripheral adenopathy or edema is identified. No motor or sensory levels are noted. Crude  visual fields are within normal range.  RADIOLOGY RESULTS: No current films for review  PLAN: Present time patient is doing well 1 month out from whole breast radiation.  She continues on letrozole without side effect.  I asked to see her back in 6 months for follow-up.  Patient knows to call sooner with any concerns.  I would like to take this opportunity to thank you for allowing me to participate in the care of your patient.Noreene Filbert, MD

## 2022-08-17 ENCOUNTER — Other Ambulatory Visit: Payer: Self-pay | Admitting: Oncology

## 2022-09-11 ENCOUNTER — Encounter: Payer: Self-pay | Admitting: Oncology

## 2022-09-11 ENCOUNTER — Inpatient Hospital Stay: Payer: BC Managed Care – PPO | Attending: Oncology | Admitting: Oncology

## 2022-09-11 ENCOUNTER — Inpatient Hospital Stay: Payer: BC Managed Care – PPO

## 2022-09-11 VITALS — BP 96/77 | HR 57 | Temp 97.3°F | Resp 18 | Wt 218.2 lb

## 2022-09-11 DIAGNOSIS — D0512 Intraductal carcinoma in situ of left breast: Secondary | ICD-10-CM | POA: Insufficient documentation

## 2022-09-11 DIAGNOSIS — Z923 Personal history of irradiation: Secondary | ICD-10-CM | POA: Insufficient documentation

## 2022-09-11 DIAGNOSIS — Z79811 Long term (current) use of aromatase inhibitors: Secondary | ICD-10-CM | POA: Insufficient documentation

## 2022-09-11 LAB — COMPREHENSIVE METABOLIC PANEL
ALT: 16 U/L (ref 0–44)
AST: 17 U/L (ref 15–41)
Albumin: 3.8 g/dL (ref 3.5–5.0)
Alkaline Phosphatase: 57 U/L (ref 38–126)
Anion gap: 8 (ref 5–15)
BUN: 14 mg/dL (ref 8–23)
CO2: 28 mmol/L (ref 22–32)
Calcium: 9.4 mg/dL (ref 8.9–10.3)
Chloride: 102 mmol/L (ref 98–111)
Creatinine, Ser: 0.78 mg/dL (ref 0.44–1.00)
GFR, Estimated: >60 mL/min (ref >60)
Glucose, Bld: 119 mg/dL — ABNORMAL HIGH (ref 70–99)
Potassium: 3.9 mmol/L (ref 3.5–5.1)
Sodium: 138 mmol/L (ref 135–145)
Total Bilirubin: 0.6 mg/dL (ref 0.3–1.2)
Total Protein: 7.1 g/dL (ref 6.5–8.1)

## 2022-09-11 NOTE — Progress Notes (Signed)
Hematology/Oncology Consult note [facility]  Telephone:([phone] Fax:([phone]  Patient Care Team: Rusty Aus, MD as PCP - General (Internal Medicine) Daiva Huge, RN as Oncology Nurse Navigator   Name of the patient: Kristine Rose  [id number]  [birth year]   Date of visit: 09/11/22  Diagnosis-left breast DCIS  Chief complaint/ Reason for visit-routine follow-up of left breast DCIS on letrozole  Heme/Onc history: Patient is a 66 year old female who underwent a screening bilateral mammogram in May 2023 which was followed by diagnostic mammogram.  Picked up a 2 cm group of amorphous calcifications in the left upper outer quadrant of the breast for which biopsy was recommended.  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  ER more than 90% positive.  Patient had a lumpectomy on 05/05/2022.  Final pathology showed a grade two 8 mm DCIS with negative margins.   Patient completed adjuvant radiation therapy and started taking letrozole in September 2023  Interval history-patient reports some issues with insomnia on and off.  Denies any significant arthralgias or hot flashes while on letrozole.  ECOG PS- 1 Pain scale- 0   Review of systems- Review of Systems  Constitutional:  Negative for chills, fever, malaise/fatigue and weight loss.  HENT:  Negative for congestion, ear discharge and nosebleeds.   Eyes:  Negative for blurred vision.  Respiratory:  Negative for cough, hemoptysis, sputum production, shortness of breath and wheezing.   Cardiovascular:  Negative for chest pain, palpitations, orthopnea and claudication.  Gastrointestinal:  Negative for abdominal pain, blood in stool, constipation, diarrhea, heartburn, melena, nausea and vomiting.  Genitourinary:  Negative for dysuria, flank pain, frequency, hematuria and urgency.  Musculoskeletal:  Negative for back pain, joint pain and myalgias.  Skin:  Negative for rash.   Neurological:  Negative for dizziness, tingling, focal weakness, seizures, weakness and headaches.  Endo/Heme/Allergies:  Does not bruise/bleed easily.  Psychiatric/Behavioral:  Negative for depression and suicidal ideas. The patient does not have insomnia.       Allergies  Allergen Reactions   Latex Hives    Redness, itching   Pravastatin     Other reaction(s): Other (See Comments) Joint pain     Past Medical History:  Diagnosis Date   Breast cancer ([place])    GERD (gastroesophageal reflux disease)    Hypertension    Pre-diabetes      Past Surgical History:  Procedure Laterality Date   ABDOMINAL HYSTERECTOMY     BREAST LUMPECTOMY Left 05/05/2022   Procedure: BREAST LUMPECTOMY;  Surgeon: Robert Bellow, MD;  Location: [facility] ORS;  Service: General;  Laterality: Left;   COLONOSCOPY WITH PROPOFOL N/A 12/13/2017   Procedure: COLONOSCOPY WITH PROPOFOL;  Surgeon: Lollie Sails, MD;  Location: [facility] ENDOSCOPY;  Service: Endoscopy;  Laterality: N/A;   COLONOSCOPY WITH PROPOFOL N/A 07/05/2022   Procedure: COLONOSCOPY WITH PROPOFOL;  Surgeon: Robert Bellow, MD;  Location: [facility] ENDOSCOPY;  Service: Endoscopy;  Laterality: N/A;    Social History   Socioeconomic History   Marital status: Married    Spouse name: Not on file   Number of children: Not on file   Years of education: Not on file   Highest education level: Not on file  Occupational History   Not on file  Tobacco Use   Smoking status: Never   Smokeless tobacco: Never  Vaping Use   Vaping Use: Never used  Substance and Sexual Activity   Alcohol use: Yes  Alcohol/week: 1.0 standard drink of alcohol    Types: 1 Glasses of wine per week    Comment: occasionaly   Drug use: No   Sexual activity: Yes  Other Topics Concern   Not on file  Social History Narrative   Not on file   Social Determinants of Health   Financial Resource Strain: Not on file  Food Insecurity: No Food Insecurity (08/10/2022)    Hunger Vital Sign    Worried About Running Out of Food in the Last Year: Never true    Ran Out of Food in the Last Year: Never true  Transportation Needs: No Transportation Needs (08/10/2022)   PRAPARE - Hydrologist (Medical): No    Lack of Transportation (Non-Medical): No  Physical Activity: Not on file  Stress: Not on file  Social Connections: Not on file  Intimate Partner Violence: Not At Risk (08/10/2022)   Humiliation, Afraid, Rape, and Kick questionnaire    Fear of Current or Ex-Partner: No    Emotionally Abused: No    Physically Abused: No    Sexually Abused: No    Family History  Problem Relation Age of Onset   Stroke Mother    Heart disease Mother    Prostate cancer Father    Heart disease Father    Testicular cancer Son    Breast cancer Neg Hx      Current Outpatient Medications:    aspirin EC 81 MG tablet, Take 81 mg by mouth daily., Disp: , Rfl:    CALCIUM-VITAMIN D PO, Take by mouth., Disp: , Rfl:    carvedilol (COREG) 25 MG tablet, Take 25 mg by mouth 2 (two) times daily with a meal., Disp: , Rfl:    hydrochlorothiazide (HYDRODIURIL) 25 MG tablet, Take 25 mg by mouth daily., Disp: , Rfl:    letrozole (FEMARA) 2.5 MG tablet, TAKE 1 TABLET BY MOUTH EVERY DAY, Disp: 90 tablet, Rfl: 0  Physical exam:  Vitals:   09/11/22 1055  BP: 96/77  Pulse: (!) 57  Resp: 18  Temp: (!) 97.3 F (36.3 C)  SpO2: 97%  Weight: 218 lb 3.2 oz (99 kg)   Physical Exam Cardiovascular:     Rate and Rhythm: Normal rate and regular rhythm.     Heart sounds: Normal heart sounds.  Pulmonary:     Effort: Pulmonary effort is normal.     Breath sounds: Normal breath sounds.  Skin:    General: Skin is warm and dry.  Neurological:     Mental Status: She is alert and oriented to person, place, and time.         Latest Ref Rng & Units 09/11/2022   10:40 AM  CMP  Glucose 70 - 99 mg/dL 119   BUN 8 - 23 mg/dL 14   Creatinine 0.44 - 1.00 mg/dL 0.78    Sodium 135 - 145 mmol/L 138   Potassium 3.5 - 5.1 mmol/L 3.9   Chloride 98 - 111 mmol/L 102   CO2 22 - 32 mmol/L 28   Calcium 8.9 - 10.3 mg/dL 9.4   Total Protein 6.5 - 8.1 g/dL 7.1   Total Bilirubin 0.3 - 1.2 mg/dL 0.6   Alkaline Phos 38 - 126 U/L 57   AST 15 - 41 U/L 17   ALT 0 - 44 U/L 16       Latest Ref Rng & Units 07/04/2022   11:14 AM  CBC  WBC 4.0 - 10.5 K/uL 6.8  Hemoglobin 12.0 - 15.0 g/dL 13.6   Hematocrit 36.0 - 46.0 % 42.6   Platelets 150 - 400 K/uL 229      Assessment and plan- Patient is a 66 y.o. female with history of left breast DCIS currently on letrozole and this is a routine follow-up visit  Patient has completed adjuvant radiation therapy and is presently on letrozole which she is tolerating it well other than occasional episodes of insomnia which is unclear if it is related to letrozole or not.  Patient will continue taking letrozole uninterrupted at this time.  I will see her back in 4 months no labs   Visit Diagnosis 1. Use of letrozole (Femara)   2. Ductal carcinoma in situ (DCIS) of left breast      Dr. Randa Evens, MD, MPH [facility] at [facility] [phone] 09/11/2022 4:43 PM

## 2022-09-11 NOTE — Progress Notes (Signed)
Pt will like to discuss insomnia and if it deals with taking letrozole.

## 2022-09-29 NOTE — Telephone Encounter (Signed)
error 

## 2022-11-19 ENCOUNTER — Other Ambulatory Visit: Payer: Self-pay | Admitting: Nurse Practitioner

## 2023-01-15 ENCOUNTER — Ambulatory Visit: Payer: BC Managed Care – PPO | Admitting: Oncology

## 2023-02-02 ENCOUNTER — Other Ambulatory Visit: Payer: Self-pay | Admitting: *Deleted

## 2023-02-02 ENCOUNTER — Inpatient Hospital Stay: Payer: Medicare HMO | Attending: Oncology | Admitting: Oncology

## 2023-02-02 ENCOUNTER — Encounter: Payer: Self-pay | Admitting: Oncology

## 2023-02-02 VITALS — BP 115/66 | HR 61 | Temp 97.5°F | Resp 16 | Wt 220.1 lb

## 2023-02-02 DIAGNOSIS — Z923 Personal history of irradiation: Secondary | ICD-10-CM | POA: Insufficient documentation

## 2023-02-02 DIAGNOSIS — Z86000 Personal history of in-situ neoplasm of breast: Secondary | ICD-10-CM | POA: Diagnosis not present

## 2023-02-02 DIAGNOSIS — Z170 Estrogen receptor positive status [ER+]: Secondary | ICD-10-CM | POA: Insufficient documentation

## 2023-02-02 DIAGNOSIS — Z08 Encounter for follow-up examination after completed treatment for malignant neoplasm: Secondary | ICD-10-CM | POA: Diagnosis not present

## 2023-02-02 DIAGNOSIS — D0512 Intraductal carcinoma in situ of left breast: Secondary | ICD-10-CM

## 2023-02-02 NOTE — Progress Notes (Signed)
Survivorship Care Plan visit completed.  Treatment summary reviewed and given to patient.  ASCO answers booklet reviewed and given to patient.  CARE program and Cancer Transitions discussed with patient along with other resources cancer center offers to patients and caregivers.  Patient verbalized understanding.    

## 2023-02-04 NOTE — Progress Notes (Signed)
Hematology/Oncology Consult note [facility]  [phone] [phone]  Patient Care Team: Danella PentonMiller, Mark F, MD as PCP - General (Internal Medicine) Hulen LusterMoore, Ana K, RN as Oncology Nurse Navigator Carmina Millerhrystal, Glenn, MD as Consulting Physician (Radiation Oncology) Creig Hinesao, Deiontae Rabel C, MD as Consulting Physician (Oncology) Earline MayotteByrnett, Jeffrey W, MD as Referring Physician (General Surgery)   Name of the patient: Kristine Rose  [id number]  [birth year]   Date of visit: 02/04/23  Diagnosis-is DCIS  Chief complaint/ Reason for visit-routine follow-up of left breast DCIS  Heme/Onc history: Patient is a 67 year old female who underwent a screening bilateral mammogram in May 2023 which was followed by diagnostic mammogram.  Picked up a 2 cm group of amorphous calcifications in the left upper outer quadrant of the breast for which biopsy was recommended.  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  ER more than 90% positive.  Patient had a lumpectomy on 05/05/2022.  Final pathology showed a grade two 8 mm DCIS with negative margins.   Patient completed adjuvant radiation therapy and started taking letrozole in September 2023  Interval history-patient has had problems tolerating letrozole with insomnia as well as fatigue.  She has therefore stopped taking letrozole  ECOG PS- 1 Pain scale- 0   Review of systems- Review of Systems  Constitutional:  Negative for chills, fever, malaise/fatigue and weight loss.  HENT:  Negative for congestion, ear discharge and nosebleeds.   Eyes:  Negative for blurred vision.  Respiratory:  Negative for cough, hemoptysis, sputum production, shortness of breath and wheezing.   Cardiovascular:  Negative for chest pain, palpitations, orthopnea and claudication.  Gastrointestinal:  Negative for abdominal pain, blood in stool, constipation, diarrhea, heartburn, melena, nausea and vomiting.  Genitourinary:  Negative for  dysuria, flank pain, frequency, hematuria and urgency.  Musculoskeletal:  Negative for back pain, joint pain and myalgias.  Skin:  Negative for rash.  Neurological:  Negative for dizziness, tingling, focal weakness, seizures, weakness and headaches.  Endo/Heme/Allergies:  Does not bruise/bleed easily.  Psychiatric/Behavioral:  Negative for depression and suicidal ideas. The patient does not have insomnia.       Allergies  Allergen Reactions   Latex Hives    Redness, itching   Pravastatin     Other reaction(s): Other (See Comments) Joint pain     Past Medical History:  Diagnosis Date   Breast cancer    GERD (gastroesophageal reflux disease)    Hypertension    Pre-diabetes      Past Surgical History:  Procedure Laterality Date   ABDOMINAL HYSTERECTOMY     BREAST LUMPECTOMY Left 05/05/2022   Procedure: BREAST LUMPECTOMY;  Surgeon: Earline MayotteByrnett, Jeffrey W, MD;  Location: [facility] ORS;  Service: General;  Laterality: Left;   COLONOSCOPY WITH PROPOFOL N/A 12/13/2017   Procedure: COLONOSCOPY WITH PROPOFOL;  Surgeon: Christena DeemSkulskie, Martin U, MD;  Location: [facility] ENDOSCOPY;  Service: Endoscopy;  Laterality: N/A;   COLONOSCOPY WITH PROPOFOL N/A 07/05/2022   Procedure: COLONOSCOPY WITH PROPOFOL;  Surgeon: Earline MayotteByrnett, Jeffrey W, MD;  Location: [facility] ENDOSCOPY;  Service: Endoscopy;  Laterality: N/A;    Social History   Socioeconomic History   Marital status: Married    Spouse name: Not on file   Number of children: Not on file   Years of education: Not on file   Highest education level: Not on file  Occupational History   Not on file  Tobacco Use   Smoking status: Never   Smokeless tobacco: Never  Vaping  Use   Vaping Use: Never used  Substance and Sexual Activity   Alcohol use: Yes    Alcohol/week: 1.0 standard drink of alcohol    Types: 1 Glasses of wine per week    Comment: occasionaly   Drug use: No   Sexual activity: Yes  Other Topics Concern   Not on file  Social History Narrative    Not on file   Social Determinants of Health   Financial Resource Strain: Not on file  Food Insecurity: No Food Insecurity (08/10/2022)   Hunger Vital Sign    Worried About Running Out of Food in the Last Year: Never true    Ran Out of Food in the Last Year: Never true  Transportation Needs: No Transportation Needs (08/10/2022)   PRAPARE - Administrator, Civil Service (Medical): No    Lack of Transportation (Non-Medical): No  Physical Activity: Not on file  Stress: Not on file  Social Connections: Not on file  Intimate Partner Violence: Not At Risk (08/10/2022)   Humiliation, Afraid, Rape, and Kick questionnaire    Fear of Current or Ex-Partner: No    Emotionally Abused: No    Physically Abused: No    Sexually Abused: No    Family History  Problem Relation Age of Onset   Stroke Mother    Heart disease Mother    Prostate cancer Father    Heart disease Father    Testicular cancer Son    Breast cancer Neg Hx      Current Outpatient Medications:    aspirin EC 81 MG tablet, Take 81 mg by mouth daily., Disp: , Rfl:    CALCIUM-VITAMIN D PO, Take by mouth., Disp: , Rfl:    carvedilol (COREG) 25 MG tablet, Take 25 mg by mouth 2 (two) times daily with a meal., Disp: , Rfl:    hydrochlorothiazide (HYDRODIURIL) 25 MG tablet, Take 25 mg by mouth daily., Disp: , Rfl:    letrozole (FEMARA) 2.5 MG tablet, TAKE 1 TABLET BY MOUTH EVERY DAY (Patient not taking: Reported on 02/02/2023), Disp: 90 tablet, Rfl: 0  Physical exam:  Vitals:   02/02/23 1028  BP: 115/66  Pulse: 61  Resp: 16  Temp: (!) 97.5 F (36.4 C)  TempSrc: Tympanic  SpO2: 97%  Weight: 220 lb 1.6 oz (99.8 kg)   Physical Exam Cardiovascular:     Rate and Rhythm: Normal rate and regular rhythm.     Heart sounds: Normal heart sounds.  Pulmonary:     Effort: Pulmonary effort is normal.     Breath sounds: Normal breath sounds.  Abdominal:     General: Bowel sounds are normal.     Palpations: Abdomen is  soft.  Musculoskeletal:     Cervical back: Normal range of motion.  Skin:    General: Skin is warm and dry.  Neurological:     Mental Status: She is alert and oriented to person, place, and time.   Breast exam was performed in seated and lying down position. Patient is status post left lumpectomy with a well-healed surgical scar. No evidence of any palpable masses. No evidence of axillary adenopathy. No evidence of any palpable masses or lumps in the right breast. No evidence of right axillary adenopathy I am not     Latest Ref Rng & Units 09/11/2022   10:40 AM  CMP  Glucose 70 - 99 mg/dL 759   BUN 8 - 23 mg/dL 14   Creatinine 1.63 - 1.00 mg/dL  0.78   Sodium 135 - 145 mmol/L 138   Potassium 3.5 - 5.1 mmol/L 3.9   Chloride 98 - 111 mmol/L 102   CO2 22 - 32 mmol/L 28   Calcium 8.9 - 10.3 mg/dL 9.4   Total Protein 6.5 - 8.1 g/dL 7.1   Total Bilirubin 0.3 - 1.2 mg/dL 0.6   Alkaline Phos 38 - 126 U/L 57   AST 15 - 41 U/L 17   ALT 0 - 44 U/L 16       Latest Ref Rng & Units 07/04/2022   11:14 AM  CBC  WBC 4.0 - 10.5 K/uL 6.8   Hemoglobin 12.0 - 15.0 g/dL 19.3   Hematocrit 79.0 - 46.0 % 42.6   Platelets 150 - 400 K/uL 229       Assessment and plan- Patient is a 67 y.o. female with left breast DCIS here for routine follow-up  Patient could not tolerate the side effects of letrozole and therefore decided to stop taking it.  Her risk of recurrence without endocrine therapy after adjuvant radiation at 10 years is about 3 to 5% at best.  Clinically patient is doing well without any signs and symptoms of recurrence based on today's exam.  She would be due for a mammogram in June 2024 which I will schedule.  I will see her back in 1 year no labs   Visit Diagnosis 1. Encounter for follow-up surveillance of ductal carcinoma in situ (DCIS) of breast      Dr. Owens Shark, MD, MPH [facility] [phone] 02/04/2023 10:41 AM

## 2023-02-14 ENCOUNTER — Ambulatory Visit
Admission: RE | Admit: 2023-02-14 | Discharge: 2023-02-14 | Disposition: A | Discharge status: Home / Self Care | Payer: Medicare HMO | Source: Ambulatory Visit | Attending: Radiation Oncology | Admitting: Radiation Oncology

## 2023-02-14 ENCOUNTER — Encounter: Payer: Self-pay | Admitting: Radiation Oncology

## 2023-02-14 VITALS — BP 113/69 | HR 68 | Temp 98.0°F | Resp 16 | Ht 68.0 in | Wt 219.1 lb

## 2023-02-14 DIAGNOSIS — Z170 Estrogen receptor positive status [ER+]: Secondary | ICD-10-CM

## 2023-02-14 DIAGNOSIS — Z923 Personal history of irradiation: Secondary | ICD-10-CM | POA: Insufficient documentation

## 2023-02-14 DIAGNOSIS — D0512 Intraductal carcinoma in situ of left breast: Secondary | ICD-10-CM | POA: Insufficient documentation

## 2023-02-14 DIAGNOSIS — Z79811 Long term (current) use of aromatase inhibitors: Secondary | ICD-10-CM | POA: Insufficient documentation

## 2023-02-14 NOTE — Progress Notes (Signed)
Radiation Oncology Follow up Note  Name: Kristine Rose   Date:   02/14/2023 MRN:  [id number] DOB: 23-Jan-1956    This 67 y.o. 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  REFERRING PROVIDER: Danella Penton, MD  HPI: Patient is a 67 year old female now out 7 months having pleated whole breast radiation to her left breast for ER positive ductal carcinoma in situ.  Seen today in routine follow-up she is doing well.  She specifically denies breast tenderness cough or bone pain..  She is currently on letrozole tolerating fairly well without side effect she has mammogram scheduled for June.  COMPLICATIONS OF TREATMENT: none  FOLLOW UP COMPLIANCE: keeps appointments   PHYSICAL EXAM:  BP 113/69   Pulse 68   Temp 98 F (36.7 C) (Tympanic)   Resp 16   Ht  (1.727 m) Comment: stated HT  Wt 219 lb 1.6 oz (99.4 kg)   BMI 33.31 kg/m  Lungs are clear to A&P cardiac examination essentially unremarkable with regular rate and rhythm. No dominant mass or nodularity is noted in either breast in 2 positions examined. Incision is well-healed. No axillary or supraclavicular adenopathy is appreciated. Cosmetic result is excellent.  Well-developed well-nourished patient in NAD. HEENT reveals PERLA, EOMI, discs not visualized.  Oral cavity is clear. No oral mucosal lesions are identified. Neck is clear without evidence of cervical or supraclavicular adenopathy. Lungs are clear to A&P. Cardiac examination is essentially unremarkable with regular rate and rhythm without murmur rub or thrill. Abdomen is benign with no organomegaly or masses noted. Motor sensory and DTR levels are equal and symmetric in the upper and lower extremities. Cranial nerves II through XII are grossly intact. Proprioception is intact. No peripheral adenopathy or edema is identified. No motor or sensory levels are noted. Crude  visual fields are within normal range.  RADIOLOGY RESULTS: No current films for review  PLAN: Present time patient is doing well 7 months out from whole breast radiation and pleased with her overall progress.  Of asked to see her back in 6 months and then we will start once year follow-up visits.  She continues on letrozole without side effect.  Patient knows to call with any concerns.  I would like to take this opportunity to thank you for allowing me to participate in the care of your patient.Carmina Miller, MD

## 2023-02-19 ENCOUNTER — Encounter: Payer: Medicare HMO | Attending: Oncology

## 2023-02-19 DIAGNOSIS — D0512 Intraductal carcinoma in situ of left breast: Secondary | ICD-10-CM

## 2023-02-19 NOTE — Progress Notes (Signed)
Completed CARE phone interview with patient. Medical history and program were reviewed, answered all questions by patient. Patient scheduled to come in tomorrow for physical orientation on 4/23.

## 2023-02-20 VITALS — Ht 67.5 in | Wt 218.5 lb

## 2023-02-20 DIAGNOSIS — D0512 Intraductal carcinoma in situ of left breast: Secondary | ICD-10-CM

## 2023-02-20 NOTE — Progress Notes (Signed)
CARE Orientation  Patient Details  Name: Kristine Rose MRN: [id number] Date of Birth: [birth year] Referring Provider:   Doristine Devoid Cancer Associated Rehabilitation & Exercise from 02/20/2023 in [facility] Cardiac and Pulmonary Rehab  Referring Provider Rickard Patience MD       Encounter Date: 02/20/2023  Check In:  Session Check In - 02/20/23 1406       Check-In   Supervising physician immediately available to respond to emergencies See telemetry face sheet for immediately available ER MD    Location [facility]-Cardiac & Pulmonary Rehab    Staff Present Ronette Deter, BS, Exercise Physiologist;Cashmere Harmes Clinton Sawyer, MS, ACSM CEP, Exercise Physiologist;Jessica Juanetta Gosling, MA, RCEP, CCRP, CCET    Virtual Visit No    Medication changes reported     No    Fall or balance concerns reported    No    Warm-up and Cool-down Not performed (comment)   and Gym orientation   Resistance Training Performed No    VAD Patient? No    PAD/SET Patient? No              6 Minute Walk     Row Name 02/20/23 1409         6 Minute Walk   Phase Initial     Distance 1245 feet     Walk Time 6 minutes     # of Rest Breaks 0     MPH 2.35     METS 2.61     RPE 13     Perceived Dyspnea  0     VO2 Peak 9.14     Symptoms Yes (comment)     Comments Bilateral hip pain 5/10- resolved with rest, left knee pain 3/10     Resting HR 71 bpm     Resting BP 108/62     Resting Oxygen Saturation  95 %     Exercise Oxygen Saturation  during 6 min walk 94 %     Max Ex. HR 97 bpm     Max Ex. BP 128/66     2 Minute Post BP 122/66                Exercise Prescription Changes - 02/20/23 1400       Response to Exercise   Blood Pressure (Admit) 108/62    Blood Pressure (Exercise) 128/66    Blood Pressure (Exit) 122/66    Heart Rate (Admit) 71 bpm    Heart Rate (Exercise) 97 bpm    Heart Rate (Exit) 66 bpm    Oxygen Saturation (Admit) 97 %    Oxygen Saturation (Exercise) 94 %    Oxygen Saturation (Exit) 96 %     Rating of Perceived Exertion (Exercise) 13    Perceived Dyspnea (Exercise) 0    Symptoms Bilateral hip pain 5/10, left knee pain 3/10    Comments walk test results              Pre Biometrics - 02/20/23 1406       Pre Biometrics   Height 5' 7.5" (1.715 m)    Weight 218 lb 8 oz (99.1 kg)    BMI (Calculated) 33.7    Single Leg Stand 16.1 seconds              Social History   Tobacco Use  Smoking Status Never  Smokeless Tobacco Never    Goals Met:  Proper associated with RPD/PD & O2 Sat Exercise tolerated well Personal goals reviewed No report  of concerns or symptoms today  Goals Unmet:  Not Applicable  Comments: First full day of orientation. All starting workloads were established based on the results of the 6 minute walk test done at initial orientation visit.  The plan for exercise progression was also introduced and progression will be customized based on patient's performance and goals.    Dr. Bethann Punches is Medical Director for [facility] Cardiac Rehabilitation.  Dr. Vida Rigger is Medical Director for [facility] Pulmonary Rehabilitation.

## 2023-02-22 DIAGNOSIS — D0512 Intraductal carcinoma in situ of left breast: Secondary | ICD-10-CM

## 2023-02-22 NOTE — Progress Notes (Signed)
Daily Session Note  Patient Details  Name: Kristine Rose MRN: [id number] Date of Birth: Mar 09, 1956 Referring Provider:   Doristine Devoid Cancer Associated Rehabilitation & Exercise from 02/20/2023 in [facility] Cardiac and Pulmonary Rehab  Referring Provider Rickard Patience MD       Encounter Date: 02/22/2023  Check In:  Session Check In - 02/22/23 1246       Check-In   Supervising physician immediately available to respond to emergencies See telemetry face sheet for immediately available ER MD    Location [facility]-Cardiac & Pulmonary Rehab    Staff Present Ronette Deter, BS, Exercise Physiologist;Melissa Caiola MS, RDN, LDN    Virtual Visit No    Medication changes reported     No    Fall or balance concerns reported    No    Warm-up and Cool-down Performed on first and last piece of equipment    Resistance Training Performed Yes    VAD Patient? No    PAD/SET Patient? No      Pain Assessment   Currently in Pain? No/denies    Multiple Pain Sites No                Social History   Tobacco Use  Smoking Status Never  Smokeless Tobacco Never    Goals Met:  Independence with exercise equipment Exercise tolerated well No report of concerns or symptoms today  Goals Unmet:  Not Applicable  Comments: First full day of exercise!  Patient was oriented to gym and equipment including functions, settings, policies, and procedures.  Patient's individual exercise prescription and treatment plan were reviewed.  All starting workloads were established based on the results of the 6 minute walk test done at initial orientation visit.  The plan for exercise progression was also introduced and progression will be customized based on patient's performance and goals.    Dr. Bethann Punches is Medical Director for [facility] Cardiac Rehabilitation.  Dr. Vida Rigger is Medical Director for [facility] Pulmonary Rehabilitation.

## 2023-02-27 DIAGNOSIS — D0512 Intraductal carcinoma in situ of left breast: Secondary | ICD-10-CM

## 2023-02-27 NOTE — Progress Notes (Signed)
Daily Session Note  Patient Details  Name: Kristine Rose MRN: [id number] Date of Birth: 11/13/1955 Referring Provider:   Doristine Devoid Cancer Associated Rehabilitation & Exercise from 02/20/2023 in [facility] Cardiac and Pulmonary Rehab  Referring Provider Rickard Patience MD       Encounter Date: 02/27/2023  Check In:  Session Check In - 02/27/23 1233       Check-In   Supervising physician immediately available to respond to emergencies See telemetry face sheet for immediately available ER MD    Location [facility]-Cardiac & Pulmonary Rehab    Staff Present Ronette Deter, BS, Exercise Physiologist;Melissa Durand MS, RDN, LDN;Jessica Rosholt, MA, RCEP, CCRP, Zackery Barefoot, MS, ACSM CEP, Exercise Physiologist    Virtual Visit No    Medication changes reported     No    Fall or balance concerns reported    No    Warm-up and Cool-down Performed on first and last piece of equipment    Resistance Training Performed Yes    VAD Patient? No    PAD/SET Patient? No      Pain Assessment   Currently in Pain? No/denies    Multiple Pain Sites No                Social History   Tobacco Use  Smoking Status Never  Smokeless Tobacco Never    Goals Met:  Independence with exercise equipment Exercise tolerated well No report of concerns or symptoms today  Goals Unmet:  Not Applicable  Comments: Pt able to follow exercise prescription today without complaint.  Will continue to monitor for progression.    Dr. Bethann Punches is Medical Director for [facility] Cardiac Rehabilitation.  Dr. Vida Rigger is Medical Director for [facility] Pulmonary Rehabilitation.

## 2023-03-01 ENCOUNTER — Encounter: Payer: Medicare HMO | Attending: Oncology

## 2023-03-01 DIAGNOSIS — D0512 Intraductal carcinoma in situ of left breast: Secondary | ICD-10-CM | POA: Insufficient documentation

## 2023-03-01 NOTE — Progress Notes (Signed)
Daily Session Note  Patient Details  Name: Kristine Rose MRN: [id number] Date of Birth: 09/18/1956 Referring Provider:   Doristine Devoid Cancer Associated Rehabilitation & Exercise from 02/20/2023 in [facility] Cardiac and Pulmonary Rehab  Referring Provider Rickard Patience MD       Encounter Date: 03/01/2023  Check In:  Session Check In - 03/01/23 1304       Check-In   Supervising physician immediately available to respond to emergencies See telemetry face sheet for immediately available ER MD    Location [facility]-Cardiac & Pulmonary Rehab    Staff Present Ronette Deter, BS, Exercise Physiologist;Kara Clinton Sawyer, MS, ACSM CEP, Exercise Physiologist    Virtual Visit No    Medication changes reported     No    Fall or balance concerns reported    No    Warm-up and Cool-down Performed on first and last piece of equipment    Resistance Training Performed Yes    VAD Patient? No    PAD/SET Patient? No      Pain Assessment   Currently in Pain? No/denies    Multiple Pain Sites No                Social History   Tobacco Use  Smoking Status Never  Smokeless Tobacco Never    Goals Met:  Independence with exercise equipment Exercise tolerated well No report of concerns or symptoms today  Goals Unmet:  Not Applicable  Comments: Pt able to follow exercise prescription today without complaint.  Will continue to monitor for progression.    Dr. Bethann Punches is Medical Director for [facility] Cardiac Rehabilitation.  Dr. Vida Rigger is Medical Director for [facility] Pulmonary Rehabilitation.

## 2023-03-06 ENCOUNTER — Encounter: Payer: Medicare HMO | Admitting: *Deleted

## 2023-03-06 DIAGNOSIS — D0512 Intraductal carcinoma in situ of left breast: Secondary | ICD-10-CM

## 2023-03-06 NOTE — Progress Notes (Signed)
Daily Session Note  Patient Details  Name: Kristine Rose MRN: [id number] Date of Birth: 1956/05/15 Referring Provider:   Doristine Devoid Cancer Associated Rehabilitation & Exercise from 02/20/2023 in [facility] Cardiac and Pulmonary Rehab  Referring Provider Rickard Patience MD       Encounter Date: 03/06/2023  Check In:  Session Check In - 03/06/23 1239       Check-In   Supervising physician immediately available to respond to emergencies See telemetry face sheet for immediately available ER MD    Location [facility]-Cardiac & Pulmonary Rehab    Staff Present Ronette Deter, BS, Exercise Physiologist;Kara Clinton Sawyer, MS, ACSM CEP, Exercise Physiologist;Hayden Kihara Juanetta Gosling, MA, RCEP, CCRP, CCET    Virtual Visit No    Medication changes reported     No    Fall or balance concerns reported    No    Warm-up and Cool-down Performed on first and last piece of equipment    Resistance Training Performed Yes    VAD Patient? No    PAD/SET Patient? No      Pain Assessment   Currently in Pain? No/denies                Social History   Tobacco Use  Smoking Status Never  Smokeless Tobacco Never    Goals Met:  Proper associated with RPD/PD & O2 Sat Exercise tolerated well No report of concerns or symptoms today Strength training completed today  Goals Unmet:  Not Applicable  Comments: Pt able to follow exercise prescription today without complaint.  Will continue to monitor for progression.    Dr. Bethann Punches is Medical Director for [facility] Cardiac Rehabilitation.  Dr. Vida Rigger is Medical Director for [facility] Pulmonary Rehabilitation.

## 2023-03-08 DIAGNOSIS — D0512 Intraductal carcinoma in situ of left breast: Secondary | ICD-10-CM

## 2023-03-08 NOTE — Progress Notes (Signed)
Daily Session Note  Patient Details  Name: Kristine Rose MRN: [id number] Date of Birth: 24-Dec-1955 Referring Provider:   Doristine Devoid Cancer Associated Rehabilitation & Exercise from 02/20/2023 in [facility] Cardiac and Pulmonary Rehab  Referring Provider Rickard Patience MD       Encounter Date: 03/08/2023  Check In:  Session Check In - 03/08/23 1257       Check-In   Supervising physician immediately available to respond to emergencies See telemetry face sheet for immediately available ER MD    Location [facility]-Cardiac & Pulmonary Rehab    Staff Present Ronette Deter, BS, Exercise Physiologist;Kara Clinton Sawyer, MS, ACSM CEP, Exercise Physiologist    Virtual Visit No    Medication changes reported     No    Fall or balance concerns reported    No    Warm-up and Cool-down Performed on first and last piece of equipment    Resistance Training Performed Yes    VAD Patient? No    PAD/SET Patient? No      Pain Assessment   Currently in Pain? No/denies    Multiple Pain Sites No                Social History   Tobacco Use  Smoking Status Never  Smokeless Tobacco Never    Goals Met:  Independence with exercise equipment Exercise tolerated well No report of concerns or symptoms today  Goals Unmet:  Not Applicable  Comments: Pt able to follow exercise prescription today without complaint.  Will continue to monitor for progression.    Dr. Bethann Punches is Medical Director for [facility] Cardiac Rehabilitation.  Dr. Vida Rigger is Medical Director for [facility] Pulmonary Rehabilitation.

## 2023-03-13 ENCOUNTER — Encounter: Payer: Medicare HMO | Admitting: *Deleted

## 2023-03-13 DIAGNOSIS — D0512 Intraductal carcinoma in situ of left breast: Secondary | ICD-10-CM

## 2023-03-13 NOTE — Progress Notes (Signed)
Daily Session Note  Patient Details  Name: Kristine Rose MRN: [id number] Date of Birth: [birth year] Referring Provider:   Doristine Devoid Cancer Associated Rehabilitation & Exercise from 02/20/2023 in [facility] Cardiac and Pulmonary Rehab  Referring Provider Rickard Patience MD       Encounter Date: 03/13/2023  Check In:  Session Check In - 03/13/23 1236       Check-In   Supervising physician immediately available to respond to emergencies See telemetry face sheet for immediately available ER MD    Location [facility]-Cardiac & Pulmonary Rehab    Staff Present Salley Scarlet, MS, ACSM CEP, Exercise Physiologist;[place] Perry Juanetta Gosling, MA, RCEP, CCRP, CCET    Virtual Visit No    Medication changes reported     No    Fall or balance concerns reported    No    Warm-up and Cool-down Performed on first and last piece of equipment    Resistance Training Performed Yes    VAD Patient? No    PAD/SET Patient? No      Pain Assessment   Currently in Pain? No/denies                Social History   Tobacco Use  Smoking Status Never  Smokeless Tobacco Never    Goals Met:  Proper associated with RPD/PD & O2 Sat Independence with exercise equipment Exercise tolerated well No report of concerns or symptoms today Strength training completed today  Goals Unmet:  Not Applicable  Comments: Pt able to follow exercise prescription today without complaint.  Will continue to monitor for progression.    Dr. Bethann Punches is Medical Director for [facility] Cardiac Rehabilitation.  Dr. Vida Rigger is Medical Director for [facility] Pulmonary Rehabilitation.

## 2023-03-15 DIAGNOSIS — D0512 Intraductal carcinoma in situ of left breast: Secondary | ICD-10-CM

## 2023-03-15 NOTE — Progress Notes (Signed)
Daily Session Note  Patient Details  Name: Kristine Rose MRN: [id number] Date of Birth: Dec 11, 1955 Referring Provider:   Doristine Devoid Cancer Associated Rehabilitation & Exercise from 02/20/2023 in [facility] Cardiac and Pulmonary Rehab  Referring Provider Rickard Patience MD       Encounter Date: 03/15/2023  Check In:  Session Check In - 03/15/23 1236       Check-In   Supervising physician immediately available to respond to emergencies See telemetry face sheet for immediately available ER MD    Location [facility]-Cardiac & Pulmonary Rehab    Staff Present Ronette Deter, BS, Exercise Physiologist;Kara Clinton Sawyer, MS, ACSM CEP, Exercise Physiologist    Virtual Visit No    Medication changes reported     No    Fall or balance concerns reported    No    Warm-up and Cool-down Performed on first and last piece of equipment    Resistance Training Performed Yes    VAD Patient? No    PAD/SET Patient? No      Pain Assessment   Currently in Pain? No/denies    Multiple Pain Sites No                Social History   Tobacco Use  Smoking Status Never  Smokeless Tobacco Never    Goals Met:  Independence with exercise equipment Exercise tolerated well No report of concerns or symptoms today  Goals Unmet:  Not Applicable  Comments: Pt able to follow exercise prescription today without complaint.  Will continue to monitor for progression.    Dr. Bethann Punches is Medical Director for [facility] Cardiac Rehabilitation.  Dr. Vida Rigger is Medical Director for [facility] Pulmonary Rehabilitation.

## 2023-03-20 ENCOUNTER — Encounter: Payer: Medicare HMO | Admitting: *Deleted

## 2023-03-20 DIAGNOSIS — D0512 Intraductal carcinoma in situ of left breast: Secondary | ICD-10-CM

## 2023-03-20 NOTE — Progress Notes (Signed)
Daily Session Note  Patient Details  Name: Kristine Rose MRN: [id number] Date of Birth: 29-Apr-1956 Referring Provider:   Doristine Devoid Cancer Associated Rehabilitation & Exercise from 02/20/2023 in [facility] Cardiac and Pulmonary Rehab  Referring Provider Rickard Patience MD       Encounter Date: 03/20/2023  Check In:  Session Check In - 03/20/23 1225       Check-In   Supervising physician immediately available to respond to emergencies See telemetry face sheet for immediately available ER MD    Location [facility]-Cardiac & Pulmonary Rehab    Staff Present Salley Scarlet, MS, ACSM CEP, Exercise Physiologist;Brylyn Novakovich Juanetta Gosling, MA, RCEP, CCRP, CCET    Virtual Visit No    Medication changes reported     No    Fall or balance concerns reported    No    Warm-up and Cool-down Performed on first and last piece of equipment    Resistance Training Performed Yes    VAD Patient? No    PAD/SET Patient? No      Pain Assessment   Currently in Pain? No/denies                Social History   Tobacco Use  Smoking Status Never  Smokeless Tobacco Never    Goals Met:  Proper associated with RPD/PD & O2 Sat Independence with exercise equipment Exercise tolerated well No report of concerns or symptoms today Strength training completed today  Goals Unmet:  Not Applicable  Comments: Pt able to follow exercise prescription today without complaint.  Will continue to monitor for progression.    Dr. Bethann Punches is Medical Director for [facility] Cardiac Rehabilitation.  Dr. Vida Rigger is Medical Director for [facility] Pulmonary Rehabilitation.

## 2023-03-22 ENCOUNTER — Encounter: Payer: Medicare HMO | Admitting: *Deleted

## 2023-03-22 DIAGNOSIS — D0512 Intraductal carcinoma in situ of left breast: Secondary | ICD-10-CM

## 2023-03-22 NOTE — Progress Notes (Signed)
Daily Session Note  Patient Details  Name: Kristine Rose MRN: [id number] Date of Birth: 01/26/1956 Referring Provider:   Doristine Devoid Cancer Associated Rehabilitation & Exercise from 02/20/2023 in [facility] Cardiac and Pulmonary Rehab  Referring Provider Rickard Patience MD       Encounter Date: 03/22/2023  Check In:  Session Check In - 03/22/23 1219       Check-In   Supervising physician immediately available to respond to emergencies See telemetry face sheet for immediately available ER MD    Location [facility]-Cardiac & Pulmonary Rehab    Staff Present Susann Givens, RN BSN;Noah Tickle, BS, Exercise Physiologist    Virtual Visit No    Medication changes reported     No    Fall or balance concerns reported    No    Warm-up and Cool-down Performed on first and last piece of equipment    Resistance Training Performed Yes    VAD Patient? No    PAD/SET Patient? No      Pain Assessment   Currently in Pain? No/denies                Social History   Tobacco Use  Smoking Status Never  Smokeless Tobacco Never    Goals Met:  Independence with exercise equipment Exercise tolerated well No report of concerns or symptoms today Strength training completed today  Goals Unmet:  Not Applicable  Comments: Pt able to follow exercise prescription today without complaint.  Will continue to monitor for progression.    Dr. Bethann Punches is Medical Director for [facility] Cardiac Rehabilitation.  Dr. Vida Rigger is Medical Director for [facility] Pulmonary Rehabilitation.

## 2023-03-29 ENCOUNTER — Encounter: Payer: Medicare HMO | Admitting: *Deleted

## 2023-03-29 DIAGNOSIS — D0512 Intraductal carcinoma in situ of left breast: Secondary | ICD-10-CM

## 2023-03-29 NOTE — Progress Notes (Signed)
Daily Session Note  Patient Details  Name: Kristine Rose MRN: [id number] Date of Birth: July 25, 1956 Referring Provider:   Doristine Devoid Cancer Associated Rehabilitation & Exercise from 02/20/2023 in [facility] Cardiac and Pulmonary Rehab  Referring Provider Rickard Patience MD       Encounter Date: 03/29/2023  Check In:  Session Check In - 03/29/23 1237       Check-In   Supervising physician immediately available to respond to emergencies See telemetry face sheet for immediately available ER MD    Location [facility]-Cardiac & Pulmonary Rehab    Staff Present Susann Givens, RN BSN;Noah Tickle, BS, Exercise Physiologist    Virtual Visit No    Medication changes reported     No    Fall or balance concerns reported    No    Warm-up and Cool-down Performed on first and last piece of equipment    Resistance Training Performed Yes    VAD Patient? No    PAD/SET Patient? No      Pain Assessment   Currently in Pain? No/denies                Social History   Tobacco Use  Smoking Status Never  Smokeless Tobacco Never    Goals Met:  Independence with exercise equipment Exercise tolerated well No report of concerns or symptoms today Strength training completed today  Goals Unmet:  Not Applicable  Comments: Pt able to follow exercise prescription today without complaint.  Will continue to monitor for progression.    Dr. Bethann Punches is Medical Director for [facility] Cardiac Rehabilitation.  Dr. Vida Rigger is Medical Director for [facility] Pulmonary Rehabilitation.

## 2023-04-02 ENCOUNTER — Ambulatory Visit
Admission: RE | Admit: 2023-04-02 | Discharge: 2023-04-02 | Disposition: A | Discharge status: Home / Self Care | Payer: Medicare HMO | Source: Ambulatory Visit | Attending: Oncology | Admitting: Oncology

## 2023-04-02 ENCOUNTER — Ambulatory Visit
Admission: RE | Admit: 2023-04-02 | Discharge: 2023-04-02 | Disposition: A | Discharge status: Home / Self Care | Payer: Medicare HMO | Source: Ambulatory Visit | Attending: Oncology

## 2023-04-02 DIAGNOSIS — D0512 Intraductal carcinoma in situ of left breast: Secondary | ICD-10-CM | POA: Insufficient documentation

## 2023-04-03 ENCOUNTER — Encounter: Payer: Medicare HMO | Attending: Oncology | Admitting: *Deleted

## 2023-04-03 ENCOUNTER — Other Ambulatory Visit: Payer: Self-pay | Admitting: Oncology

## 2023-04-03 DIAGNOSIS — R921 Mammographic calcification found on diagnostic imaging of breast: Secondary | ICD-10-CM

## 2023-04-03 DIAGNOSIS — R928 Other abnormal and inconclusive findings on diagnostic imaging of breast: Secondary | ICD-10-CM

## 2023-04-03 DIAGNOSIS — D0512 Intraductal carcinoma in situ of left breast: Secondary | ICD-10-CM | POA: Insufficient documentation

## 2023-04-03 NOTE — Progress Notes (Signed)
Daily Session Note  Patient Details  Name: Kristine Rose MRN: [id number] Date of Birth: [birth year] Referring Provider:   Doristine Devoid Cancer Associated Rehabilitation & Exercise from 02/20/2023 in [facility] Cardiac and Pulmonary Rehab  Referring Provider Rickard Patience MD       Encounter Date: 04/03/2023  Check In:  Session Check In - 04/03/23 1257       Check-In   Supervising physician immediately available to respond to emergencies See telemetry face sheet for immediately available ER MD    Location [facility]-Cardiac & Pulmonary Rehab    Staff Present Ronette Deter, BS, Exercise Physiologist;Yogi Arther Juanetta Gosling, MA, RCEP, CCRP, CCET    Virtual Visit No    Medication changes reported     No    Fall or balance concerns reported    No    Warm-up and Cool-down Performed on first and last piece of equipment    Resistance Training Performed Yes    VAD Patient? No    PAD/SET Patient? No      Pain Assessment   Currently in Pain? No/denies                Social History   Tobacco Use  Smoking Status Never  Smokeless Tobacco Never    Goals Met:  Proper associated with RPD/PD & O2 Sat Independence with exercise equipment Exercise tolerated well No report of concerns or symptoms today Strength training completed today  Goals Unmet:  Not Applicable  Comments: Pt able to follow exercise prescription today without complaint.  Will continue to monitor for progression.    Dr. Bethann Punches is Medical Director for [facility] Cardiac Rehabilitation.  Dr. Vida Rigger is Medical Director for [facility] Pulmonary Rehabilitation.

## 2023-04-05 ENCOUNTER — Encounter: Payer: Medicare HMO | Admitting: *Deleted

## 2023-04-05 DIAGNOSIS — D0512 Intraductal carcinoma in situ of left breast: Secondary | ICD-10-CM

## 2023-04-05 NOTE — Progress Notes (Signed)
Daily Session Note  Patient Details  Name: Arron Lunde MRN: [id number] Date of Birth: [birth year] Referring Provider:   Doristine Devoid Cancer Associated Rehabilitation & Exercise from 02/20/2023 in [facility] Cardiac and Pulmonary Rehab  Referring Provider Rickard Patience MD       Encounter Date: 04/05/2023  Check In:  Session Check In - 04/05/23 1226       Check-In   Supervising physician immediately available to respond to emergencies See telemetry face sheet for immediately available ER MD    Location [facility]-Cardiac & Pulmonary Rehab    Staff Present Ronette Deter, BS, Exercise Physiologist;Adriona Kaney Wenden, MA, RCEP, CCRP, CCET    Virtual Visit No    Medication changes reported     No    Fall or balance concerns reported    No    Warm-up and Cool-down Performed on first and last piece of equipment    Resistance Training Performed Yes    VAD Patient? No    PAD/SET Patient? No      Pain Assessment   Currently in Pain? No/denies                Social History   Tobacco Use  Smoking Status Never  Smokeless Tobacco Never    Goals Met:  Proper associated with RPD/PD & O2 Sat Independence with exercise equipment Exercise tolerated well No report of concerns or symptoms today Strength training completed today  Goals Unmet:  Not Applicable  Comments: Pt able to follow exercise prescription today without complaint.  Will continue to monitor for progression.    Dr. Bethann Punches is Medical Director for [facility] Cardiac Rehabilitation.  Dr. Vida Rigger is Medical Director for [facility] Pulmonary Rehabilitation.

## 2023-04-11 ENCOUNTER — Ambulatory Visit
Admission: RE | Admit: 2023-04-11 | Discharge: 2023-04-11 | Disposition: A | Discharge status: Home / Self Care | Payer: Medicare HMO | Source: Ambulatory Visit | Attending: Oncology | Admitting: Oncology

## 2023-04-11 DIAGNOSIS — R921 Mammographic calcification found on diagnostic imaging of breast: Secondary | ICD-10-CM

## 2023-04-11 DIAGNOSIS — R928 Other abnormal and inconclusive findings on diagnostic imaging of breast: Secondary | ICD-10-CM | POA: Insufficient documentation

## 2023-04-11 HISTORY — PX: BREAST BIOPSY: SHX20

## 2023-04-11 MED ORDER — LIDOCAINE-EPINEPHRINE 1 %-1:100000 IJ SOLN
10.0000 mL | Freq: Once | INTRAMUSCULAR | Status: AC
  Administered 2023-04-11: 10 mL

## 2023-04-11 MED ORDER — LIDOCAINE HCL 1 % IJ SOLN
15.0000 mL | Freq: Once | INTRAMUSCULAR | Status: AC
  Administered 2023-04-11: 15 mL

## 2023-04-16 ENCOUNTER — Encounter: Payer: Self-pay | Admitting: *Deleted

## 2023-04-16 NOTE — Progress Notes (Signed)
Called Kristine Rose to inform her breast biopsy was benign.   She was very appreciative of the call.

## 2023-04-17 ENCOUNTER — Encounter: Payer: Self-pay | Admitting: *Deleted

## 2023-04-17 DIAGNOSIS — D0512 Intraductal carcinoma in situ of left breast: Secondary | ICD-10-CM

## 2023-04-17 NOTE — Progress Notes (Signed)
Daily Session Note  Patient Details  Name: Kristine Rose MRN: [id number] Date of Birth: 11-21-1955 Referring Provider:   Doristine Devoid Cancer Associated Rehabilitation & Exercise from 02/20/2023 in [facility] Cardiac and Pulmonary Rehab  Referring Provider Rickard Patience MD       Encounter Date: 04/17/2023  Check In:  Session Check In - 04/17/23 1246       Check-In   Supervising physician immediately available to respond to emergencies See telemetry face sheet for immediately available ER MD    Location [facility]-Cardiac & Pulmonary Rehab    Staff Present Darcel Bayley, RN,BC,MSN;Jalissa Heinzelman, BS, Exercise Physiologist    Virtual Visit No    Medication changes reported     No    Fall or balance concerns reported    No    Warm-up and Cool-down Performed on first and last piece of equipment    Resistance Training Performed Yes    VAD Patient? No    PAD/SET Patient? No      Pain Assessment   Currently in Pain? No/denies    Multiple Pain Sites No                Social History   Tobacco Use  Smoking Status Never  Smokeless Tobacco Never    Goals Met:  Independence with exercise equipment Exercise tolerated well No report of concerns or symptoms today  Goals Unmet:  Not Applicable  Comments: Pt able to follow exercise prescription today without complaint.  Will continue to monitor for progression.    Dr. Bethann Punches is Medical Director for [facility] Cardiac Rehabilitation.  Dr. Vida Rigger is Medical Director for [facility] Pulmonary Rehabilitation.

## 2023-04-17 NOTE — Progress Notes (Signed)
Kristine Rose called concerned about a couple issues she has been having.   She is experiencing muscle cramps on her left side that radiate to her back, leg cramping and numbness/tingling of her feet.   Dr. Smith Robert will see her and check some blood work the week of 7/15.

## 2023-04-18 ENCOUNTER — Emergency Department: Payer: Medicare HMO

## 2023-04-18 ENCOUNTER — Emergency Department
Admission: EM | Admit: 2023-04-18 | Discharge: 2023-04-18 | Disposition: A | Discharge status: Home / Self Care | Payer: Medicare HMO | Attending: Emergency Medicine | Admitting: Emergency Medicine

## 2023-04-18 ENCOUNTER — Other Ambulatory Visit: Payer: Self-pay

## 2023-04-18 DIAGNOSIS — S42294A Other nondisplaced fracture of upper end of right humerus, initial encounter for closed fracture: Secondary | ICD-10-CM | POA: Insufficient documentation

## 2023-04-18 DIAGNOSIS — W010XXA Fall on same level from slipping, tripping and stumbling without subsequent striking against object, initial encounter: Secondary | ICD-10-CM | POA: Diagnosis not present

## 2023-04-18 DIAGNOSIS — S4991XA Unspecified injury of right shoulder and upper arm, initial encounter: Secondary | ICD-10-CM | POA: Diagnosis present

## 2023-04-18 DIAGNOSIS — W19XXXA Unspecified fall, initial encounter: Secondary | ICD-10-CM

## 2023-04-18 MED ORDER — OXYCODONE-ACETAMINOPHEN 5-325 MG PO TABS: 1.0000 | ORAL_TABLET | Freq: Three times a day (TID) | ORAL | 0 refills | Status: AC | PRN

## 2023-04-18 MED ORDER — OXYCODONE-ACETAMINOPHEN 5-325 MG PO TABS
1.0000 | ORAL_TABLET | Freq: Once | ORAL | Status: AC
  Administered 2023-04-18: 1 via ORAL
  Filled 2023-04-18: qty 1

## 2023-04-18 MED ORDER — ONDANSETRON 4 MG PO TBDP
4.0000 mg | ORAL_TABLET | Freq: Once | ORAL | Status: AC
  Administered 2023-04-18: 4 mg via ORAL
  Filled 2023-04-18: qty 1

## 2023-04-18 NOTE — Discharge Instructions (Addendum)
Your exam and x-ray confirm a nondisplaced fracture to the top of the humerus bone.  This will be treated with immobilization.  You can expect to be sore and stiff for the next few days.  Apply ice to reduce swelling.  Take the prescription meds as directed.  You may take OTC Tylenol or Motrin for nondrowsy pain relief.  Follow-up with Dr. Joice Lofts next week for further evaluation and fracture care.

## 2023-04-18 NOTE — ED Provider Notes (Signed)
[facility] Emergency Department Provider Note     Event Date/Time   First MD Initiated Contact with Patient 04/18/23 2039     (approximate)   History   Fall   HPI  Kristine Rose is a 67 y.o. left-handed female presents to the ED for evaluation of acute right shoulder pain after mechanical fall.  Patient was in the medical mall the hospital with her husband, when she slipped and fell, landing on her right shoulder.  She presents to the ED with right arm pain.  She denies any head injury or LOC.     Physical Exam   Triage Vital Signs: ED Triage Vitals  Enc Vitals Group     BP 04/18/23 2039 (!) 219/96     Pulse Rate 04/18/23 2039 77     Resp 04/18/23 2039 20     Temp 04/18/23 2039 (!) 97.5 F (36.4 C)     Temp Source 04/18/23 2039 Oral     SpO2 04/18/23 2039 100 %     Weight 04/18/23 2038 218 lb 14.7 oz (99.3 kg)     Height 04/18/23 2038 5' 7.5" (1.715 m)     Head Circumference --      Peak Flow --      Pain Score 04/18/23 2039 10     Pain Loc --      Pain Edu? --      Excl. in GC? --     Most recent vital signs: Vitals:   04/18/23 2039  BP: (!) 219/96  Pulse: 77  Resp: 20  Temp: (!) 97.5 F (36.4 C)  SpO2: 100%    General Awake, no distress. NAD HEENT NCAT. PERRL. EOMI. No rhinorrhea. Mucous membranes are moist.  CV:  Good peripheral perfusion.  RESP:  Normal effort.  ABD:  No distention.  MSK:  Right shoulder without obvious deformity, dislocation, or sulcus sign.  Patient with shoulder held and abduction secondary to pain and comfort.  Normal composite fist distally.  Normal elbow flexion and extension range.  Mild tender palpation over the deltoid region of the right shoulder. NEURO: Cranial nerves II to XII grossly intact.   ED Results / Procedures / Treatments   Labs (all labs ordered are listed, but only abnormal results are displayed) Labs Reviewed - No data to display   EKG   RADIOLOGY  I personally  viewed and evaluated these images as part of my medical decision making, as well as reviewing the written report by the radiologist.  ED Provider Interpretation: Acute nondisplaced fracture of the humerus at the tuberosity  DG Shoulder Right  Result Date: 04/18/2023 CLINICAL DATA:  Right shoulder pain after fall. EXAM: RIGHT SHOULDER - 2+ VIEW COMPARISON:  None Available. FINDINGS: Possible nondisplaced fracture involving the greater tuberosity. No dislocation is noted. There is no evidence of arthropathy or other focal bone abnormality. Soft tissues are unremarkable. IMPRESSION: Possible nondisplaced fracture involving greater tuberosity of proximal right humerus. Electronically Signed   By: Lupita Raider M.D.   On: 04/18/2023 21:13     PROCEDURES:  Critical Care performed: No  Procedures   MEDICATIONS ORDERED IN ED: Medications  ondansetron (ZOFRAN-ODT) disintegrating tablet 4 mg (4 mg Oral Given 04/18/23 2150)  oxyCODONE-acetaminophen (PERCOCET/ROXICET) 5-325 MG per tablet 1 tablet (1 tablet Oral Given 04/18/23 2150)     IMPRESSION / MDM / ASSESSMENT AND PLAN / ED COURSE  I reviewed the triage vital signs and the nursing notes.  Differential diagnosis includes, but is not limited to, shoulder sprain, shoulder dislocation, shoulder fracture, shoulder contusion  Patient's presentation is most consistent with acute complicated illness / injury requiring diagnostic workup.   ----------------------------------------- 10:17 PM on 04/18/2023 ----------------------------------------- S/W  Dr. Joice Lofts: Agreeable to the plan of arm sling for immobilization and patient stable for outpatient management.  Father the patient in the clinic.  Patient's diagnosis is consistent with chemical fall resulting in a nondisplaced right greater tuberosity fracture of the humerus.  X-ray reviewed by me confirms a fracture pathology.  Patient with no neurodeficits on exam.   Patient will be discharged home with prescriptions for Percocet. Patient is to follow up with Dr. Joice Lofts as discussed, as needed or otherwise directed. Patient is given ED precautions to return to the ED for any worsening or new symptoms.    FINAL CLINICAL IMPRESSION(S) / ED DIAGNOSES   Final diagnoses:  Other closed nondisplaced fracture of proximal end of right humerus, initial encounter  Fall, initial encounter     Rx / DC Orders   ED Discharge Orders          Ordered    oxyCODONE-acetaminophen (PERCOCET) 5-325 MG tablet  Every 8 hours PRN        04/18/23 2213             Note:  This document was prepared using Dragon voice recognition software and may include unintentional dictation errors.    Lissa Hoard, PA-C 04/18/23 2238    Trinna Post, MD 04/19/23 [phone]

## 2023-04-18 NOTE — ED Triage Notes (Signed)
Pt presents ambulatory to triage via POV with complaints of shoulder pain following a fall tonight. Pt was walking in the medical mall with her husband, she slipped and fell landing on her R shoulder and has positional pain when resting her arm down by her side. Pt takes baby ASA daily - no LOC; didn't hit her head. Of note, pain to the R arm; Hx of lumpectomy in the L. A&Ox4 at this time. Denies CP or SOB.

## 2023-04-18 NOTE — ED Notes (Signed)
AC (Rob), Consulting civil engineer notified of the patients arrival.

## 2023-05-10 ENCOUNTER — Encounter: Payer: Medicare HMO | Attending: Oncology

## 2023-05-10 DIAGNOSIS — D0512 Intraductal carcinoma in situ of left breast: Secondary | ICD-10-CM | POA: Insufficient documentation

## 2023-05-14 ENCOUNTER — Other Ambulatory Visit: Payer: Self-pay

## 2023-05-14 DIAGNOSIS — D0512 Intraductal carcinoma in situ of left breast: Secondary | ICD-10-CM

## 2023-05-15 ENCOUNTER — Inpatient Hospital Stay: Payer: Medicare HMO | Attending: Oncology

## 2023-05-15 ENCOUNTER — Inpatient Hospital Stay: Payer: Medicare HMO | Admitting: Oncology

## 2023-05-15 ENCOUNTER — Encounter: Payer: Self-pay | Admitting: Oncology

## 2023-05-15 VITALS — BP 129/81 | HR 67 | Temp 97.1°F | Resp 18 | Ht 67.5 in | Wt 225.6 lb

## 2023-05-15 DIAGNOSIS — D0512 Intraductal carcinoma in situ of left breast: Secondary | ICD-10-CM | POA: Diagnosis not present

## 2023-05-15 DIAGNOSIS — Z170 Estrogen receptor positive status [ER+]: Secondary | ICD-10-CM | POA: Diagnosis not present

## 2023-05-15 DIAGNOSIS — R928 Other abnormal and inconclusive findings on diagnostic imaging of breast: Secondary | ICD-10-CM | POA: Diagnosis not present

## 2023-05-15 DIAGNOSIS — Z79811 Long term (current) use of aromatase inhibitors: Secondary | ICD-10-CM | POA: Insufficient documentation

## 2023-05-15 LAB — CBC WITH DIFFERENTIAL (CANCER CENTER ONLY)
Abs Immature Granulocytes: 0.06 10*3/uL (ref 0.00–0.07)
Basophils Absolute: 0.1 10*3/uL (ref 0.0–0.1)
Basophils Relative: 1 %
Eosinophils Absolute: 0.3 10*3/uL (ref 0.0–0.5)
Eosinophils Relative: 4 %
HCT: 42 % (ref 36.0–46.0)
Hemoglobin: 13.5 g/dL (ref 12.0–15.0)
Immature Granulocytes: 1 %
Lymphocytes Relative: 19 %
Lymphs Abs: 1.7 10*3/uL (ref 0.7–4.0)
MCH: 27.3 pg (ref 26.0–34.0)
MCHC: 32.1 g/dL (ref 30.0–36.0)
MCV: 85 fL (ref 80.0–100.0)
Monocytes Absolute: 0.5 10*3/uL (ref 0.1–1.0)
Monocytes Relative: 6 %
Neutro Abs: 6.1 10*3/uL (ref 1.7–7.7)
Neutrophils Relative %: 69 %
Platelet Count: 255 10*3/uL (ref 150–400)
RBC: 4.94 MIL/uL (ref 3.87–5.11)
RDW: 15 % (ref 11.5–15.5)
WBC Count: 8.6 10*3/uL (ref 4.0–10.5)
nRBC: 0 % (ref 0.0–0.2)

## 2023-05-15 LAB — BASIC METABOLIC PANEL - CANCER CENTER ONLY
Anion gap: 7 (ref 5–15)
BUN: 21 mg/dL (ref 8–23)
CO2: 30 mmol/L (ref 22–32)
Calcium: 9.3 mg/dL (ref 8.9–10.3)
Chloride: 104 mmol/L (ref 98–111)
Creatinine: 0.88 mg/dL (ref 0.44–1.00)
GFR, Estimated: >60 mL/min (ref >60)
Glucose, Bld: 127 mg/dL — ABNORMAL HIGH (ref 70–99)
Potassium: 3.6 mmol/L (ref 3.5–5.1)
Sodium: 141 mmol/L (ref 135–145)

## 2023-05-15 NOTE — Progress Notes (Signed)
Hematology/Oncology Consult note [facility]  Telephone:([phone] [phone]  Patient Care Team: Danella Penton, MD as PCP - General (Internal Medicine) Hulen Luster, RN as Oncology Nurse Navigator Carmina Miller, MD as Consulting Physician (Radiation Oncology) Creig Hines, MD as Consulting Physician (Oncology) Lemar Livings Merrily Pew, MD as Referring Physician (General Surgery) Sung Amabile, DO as Consulting Physician (General Surgery)   Name of the patient: Kristine Rose  [id number]  [birth year]   Date of visit: 05/15/23  Diagnosis-left breast DCIS  Chief complaint/ Reason for visit-discussed biopsy results and further management  Heme/Onc history: Patient is a 67 year old female who underwent a screening bilateral mammogram in May 2023 which was followed by diagnostic mammogram.  Picked up a 2 cm group of amorphous calcifications in the left upper outer quadrant of the breast for which biopsy was recommended.  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  ER more than 90% positive.  Patient had a lumpectomy on 05/05/2022.  Final pathology showed a grade two 8 mm DCIS with negative margins.   Patient completed adjuvant radiation therapy and started taking letrozole in September 2023.  Patient subsequently stopped taking it a few months later due to insomnia and fatigue    Interval history-patient recently had a nondisplaced fracture involving the greater tuberosity of the right humerus requiring surgery.  She also had an abnormal With possible calcifications noted in her left breast which was subsequently biopsied and was negative for malignancy.  She is concerned about pea-sized swelling she has noted in her left areola at the 6 o'clock position following the fall  ECOG PS- 1 Pain scale- 3 Opioid associated constipation- no  Review of systems- Review of Systems  Constitutional:  Positive for malaise/fatigue. Negative for chills,  fever and weight loss.  HENT:  Negative for congestion, ear discharge and nosebleeds.   Eyes:  Negative for blurred vision.  Respiratory:  Negative for cough, hemoptysis, sputum production, shortness of breath and wheezing.   Cardiovascular:  Negative for chest pain, palpitations, orthopnea and claudication.  Gastrointestinal:  Negative for abdominal pain, blood in stool, constipation, diarrhea, heartburn, melena, nausea and vomiting.  Genitourinary:  Negative for dysuria, flank pain, frequency, hematuria and urgency.  Musculoskeletal:  Positive for joint pain. Negative for back pain and myalgias.  Skin:  Negative for rash.  Neurological:  Negative for dizziness, tingling, focal weakness, seizures, weakness and headaches.  Endo/Heme/Allergies:  Does not bruise/bleed easily.  Psychiatric/Behavioral:  Negative for depression and suicidal ideas. The patient does not have insomnia.       Allergies  Allergen Reactions   Latex Hives    Redness, itching   Pravastatin     Other reaction(s): Other (See Comments) Joint pain     Past Medical History:  Diagnosis Date   Breast cancer (HCC)    GERD (gastroesophageal reflux disease)    Hypertension    Pre-diabetes      Past Surgical History:  Procedure Laterality Date   ABDOMINAL HYSTERECTOMY     BREAST BIOPSY Left 04/11/2023   Stereo Bx, X Clip- path pending   BREAST BIOPSY Left 04/11/2023   MM LT BREAST BX W LOC DEV 1ST LESION IMAGE BX SPEC STEREO GUIDE 04/11/2023 [facility]   BREAST LUMPECTOMY Left 05/05/2022   Procedure: BREAST LUMPECTOMY;  Surgeon: Earline Mayotte, MD;  Location: [facility] ORS;  Service: General;  Laterality: Left;   BREAST LUMPECTOMY Left 05/05/2022   BREAST, LEFT; EXCISION:  -  DUCTAL CARCINOMA IN SITU, INTERMEDIATE GRADE, WITH CALCIFICATIONS AND  FOCAL COMEDO TYPE NECROSIS BREAST, LEFT; EXCISION:  - DUCTAL CARCINOMA IN SITU, INTERMEDIATE GRADE, WITH CALCIFICATIONS AND  FOCAL COMEDO TYPE NECROSISAll margins  negative for DCIS      Distance from DCIS to closest margin: 5 mm      Specify closest margin: Anterior   COLONOSCOPY WITH PROPOFOL N/A 12/13/2017   Procedure: COLONOSCOPY WITH PROPOFOL;  Surgeon: Christena Deem, MD;  Location: [facility] ENDOSCOPY;  Service: Endoscopy;  Laterality: N/A;   COLONOSCOPY WITH PROPOFOL N/A 07/05/2022   Procedure: COLONOSCOPY WITH PROPOFOL;  Surgeon: Earline Mayotte, MD;  Location: [facility] ENDOSCOPY;  Service: Endoscopy;  Laterality: N/A;    Social History   Socioeconomic History   Marital status: Married    Spouse name: Not on file   Number of children: Not on file   Years of education: Not on file   Highest education level: Not on file  Occupational History   Not on file  Tobacco Use   Smoking status: Never   Smokeless tobacco: Never  Vaping Use   Vaping status: Never Used  Substance and Sexual Activity   Alcohol use: Yes    Alcohol/week: 1.0 standard drink of alcohol    Types: 1 Glasses of wine per week    Comment: occasionaly   Drug use: No   Sexual activity: Yes  Other Topics Concern   Not on file  Social History Narrative   Not on file   Social Determinants of Health   Financial Resource Strain: Not on file  Food Insecurity: No Food Insecurity (08/10/2022)   Hunger Vital Sign    Worried About Running Out of Food in the Last Year: Never true    Ran Out of Food in the Last Year: Never true  Transportation Needs: No Transportation Needs (08/10/2022)   PRAPARE - Administrator, Civil Service (Medical): No    Lack of Transportation (Non-Medical): No  Physical Activity: Not on file  Stress: Not on file  Social Connections: Not on file  Intimate Partner Violence: Not At Risk (08/10/2022)   Humiliation, Afraid, Rape, and Kick questionnaire    Fear of Current or Ex-Partner: No    Emotionally Abused: No    Physically Abused: No    Sexually Abused: No    Family History  Problem Relation Age of Onset   Stroke Mother    Heart  disease Mother    Prostate cancer Father    Heart disease Father    Testicular cancer Son    Breast cancer Neg Hx      Current Outpatient Medications:    aspirin EC 81 MG tablet, Take 81 mg by mouth daily., Disp: , Rfl:    CALCIUM-VITAMIN D PO, Take by mouth. (Patient not taking: Reported on 02/19/2023), Disp: , Rfl:    carvedilol (COREG) 25 MG tablet, Take 25 mg by mouth 2 (two) times daily with a meal. Patient states taking 12.5 mg BID, Disp: , Rfl:    hydrochlorothiazide (HYDRODIURIL) 25 MG tablet, Take 25 mg by mouth daily. Patient states taking PRN, Disp: , Rfl:    letrozole (FEMARA) 2.5 MG tablet, TAKE 1 TABLET BY MOUTH EVERY DAY (Patient not taking: Reported on 02/02/2023), Disp: 90 tablet, Rfl: 0  Physical exam:  Vitals:   05/15/23 1325  BP: 129/81  Pulse: 67  Resp: 18  Temp: (!) 97.1 F (36.2 C)  TempSrc: Tympanic  SpO2: 94%  Weight: 225 lb 9.6  oz (102.3 kg)  Height: 5' 7.5" (1.715 m)   Physical Exam Cardiovascular:     Rate and Rhythm: Normal rate and regular rhythm.     Heart sounds: Normal heart sounds.  Pulmonary:     Effort: Pulmonary effort is normal.     Breath sounds: Normal breath sounds.  Abdominal:     General: Bowel sounds are normal.     Palpations: Abdomen is soft.  Skin:    General: Skin is warm and dry.  Neurological:     Mental Status: She is alert and oriented to person, place, and time.    Breast exam was performed in seated and lying down position. Patient is status post left lumpectomy with a well-healed surgical scar.  There is a pea-sized 0.5 cm circular mass noted in the left areola at the 6 o'clock position possibly consistent with a cyst or hematoma. No evidence of axillary adenopathy. No evidence of any palpable masses or lumps in the right breast. No evidence of right axillary adenopathy      Latest Ref Rng & Units 05/15/2023    1:14 PM  CMP  Glucose 70 - 99 mg/dL 161   BUN 8 - 23 mg/dL 21   Creatinine 0.96 - 1.00 mg/dL 0.45    Sodium 409 - 811 mmol/L 141   Potassium 3.5 - 5.1 mmol/L 3.6   Chloride 98 - 111 mmol/L 104   CO2 22 - 32 mmol/L 30   Calcium 8.9 - 10.3 mg/dL 9.3       Latest Ref Rng & Units 05/15/2023    1:15 PM  CBC  WBC 4.0 - 10.5 K/uL 8.6   Hemoglobin 12.0 - 15.0 g/dL 91.4   Hematocrit 78.2 - 46.0 % 42.0   Platelets 150 - 400 K/uL 255     No images are attached to the encounter.  DG Shoulder Right  Result Date: 04/18/2023 CLINICAL DATA:  Right shoulder pain after fall. EXAM: RIGHT SHOULDER - 2+ VIEW COMPARISON:  None Available. FINDINGS: Possible nondisplaced fracture involving the greater tuberosity. No dislocation is noted. There is no evidence of arthropathy or other focal bone abnormality. Soft tissues are unremarkable. IMPRESSION: Possible nondisplaced fracture involving greater tuberosity of proximal right humerus. Electronically Signed   By: Lupita Raider M.D.   On: 04/18/2023 21:13     Assessment and plan- Patient is a 67 y.o. female with history of left breast DCIS here for following issues:  Left breast DCIS: She is s/p surgery and radiation therapy.  She could not tolerate letrozole due to fatigue and insomnia.  For now she wants to stay off letrozole but is willing to revisit this in about 3 to 4 months time  There is a pea-sized swelling noted in her left areola at the 6 o'clock position following a fallPatient had about a month ago.  This could be hematoma.  I have asked the patient to keep an eye on this since patient just had a mammogram in early June 2024 which did not show any evidence of masses.  Biopsy of the suspicious calcifications was negative for malignancy.  If the swelling persists in a month or so we will plan to get a repeat ultrasound at that time.   I will see her sometime in December no labs   Visit Diagnosis 1. Ductal carcinoma in situ (DCIS) of left breast   2. Abnormal mammogram      Dr. Owens Shark, MD, MPH [facility] [phone] 05/15/2023 3:01 PM

## 2023-05-16 ENCOUNTER — Encounter: Payer: Self-pay | Admitting: *Deleted

## 2023-05-16 DIAGNOSIS — D0512 Intraductal carcinoma in situ of left breast: Secondary | ICD-10-CM

## 2023-05-16 NOTE — Progress Notes (Signed)
Kristine Rose called to inform staff that she needs to be discharged at this time from the CARE program due to needing PT. Her MD will refer again once she is ready to participate.

## 2023-06-13 ENCOUNTER — Other Ambulatory Visit: Payer: Self-pay | Admitting: Orthopedic Surgery

## 2023-06-13 DIAGNOSIS — S42201D Unspecified fracture of upper end of right humerus, subsequent encounter for fracture with routine healing: Secondary | ICD-10-CM

## 2023-06-15 ENCOUNTER — Ambulatory Visit: Admission: RE | Admit: 2023-06-15 | Payer: Medicare HMO | Source: Ambulatory Visit

## 2023-06-15 DIAGNOSIS — S42201D Unspecified fracture of upper end of right humerus, subsequent encounter for fracture with routine healing: Secondary | ICD-10-CM

## 2023-06-18 ENCOUNTER — Encounter: Payer: Self-pay | Admitting: Oncology

## 2023-06-18 NOTE — Telephone Encounter (Signed)
We can get a left breast ultrasound but if nothing changes, she will get mammo as planned in the future

## 2023-06-19 ENCOUNTER — Other Ambulatory Visit: Payer: Self-pay | Admitting: *Deleted

## 2023-06-19 DIAGNOSIS — N6320 Unspecified lump in the left breast, unspecified quadrant: Secondary | ICD-10-CM

## 2023-06-19 DIAGNOSIS — W19XXXA Unspecified fall, initial encounter: Secondary | ICD-10-CM

## 2023-06-20 ENCOUNTER — Other Ambulatory Visit: Payer: Self-pay | Admitting: Oncology

## 2023-06-20 DIAGNOSIS — W19XXXA Unspecified fall, initial encounter: Secondary | ICD-10-CM

## 2023-06-20 DIAGNOSIS — N6320 Unspecified lump in the left breast, unspecified quadrant: Secondary | ICD-10-CM

## 2023-06-29 ENCOUNTER — Ambulatory Visit
Admission: RE | Admit: 2023-06-29 | Discharge: 2023-06-29 | Disposition: A | Discharge status: Home / Self Care | Payer: Medicare HMO | Source: Ambulatory Visit | Attending: Oncology | Admitting: Oncology

## 2023-06-29 DIAGNOSIS — N6320 Unspecified lump in the left breast, unspecified quadrant: Secondary | ICD-10-CM

## 2023-06-29 DIAGNOSIS — R609 Edema, unspecified: Secondary | ICD-10-CM | POA: Diagnosis not present

## 2023-06-29 DIAGNOSIS — W19XXXA Unspecified fall, initial encounter: Secondary | ICD-10-CM | POA: Insufficient documentation

## 2023-06-29 DIAGNOSIS — Z9889 Other specified postprocedural states: Secondary | ICD-10-CM | POA: Insufficient documentation

## 2023-06-29 DIAGNOSIS — R921 Mammographic calcification found on diagnostic imaging of breast: Secondary | ICD-10-CM | POA: Diagnosis not present

## 2023-08-06 ENCOUNTER — Ambulatory Visit: Payer: BC Managed Care – PPO | Admitting: Oncology

## 2023-08-10 ENCOUNTER — Encounter: Payer: Self-pay | Admitting: Oncology

## 2023-08-10 ENCOUNTER — Inpatient Hospital Stay: Payer: Medicare HMO | Attending: Oncology | Admitting: Oncology

## 2023-08-10 VITALS — BP 117/90 | HR 74 | Temp 96.5°F | Resp 17 | Ht 67.0 in | Wt 225.6 lb

## 2023-08-10 DIAGNOSIS — Z923 Personal history of irradiation: Secondary | ICD-10-CM | POA: Diagnosis not present

## 2023-08-10 DIAGNOSIS — Z08 Encounter for follow-up examination after completed treatment for malignant neoplasm: Secondary | ICD-10-CM | POA: Diagnosis not present

## 2023-08-10 DIAGNOSIS — Z86000 Personal history of in-situ neoplasm of breast: Secondary | ICD-10-CM | POA: Diagnosis present

## 2023-08-10 NOTE — Progress Notes (Signed)
Hematology/Oncology Consult note [facility]  Telephone:([phone] Fax:(336) [phone]  Patient Care Team: Danella Penton, MD as PCP - General (Internal Medicine) Hulen Luster, RN as Oncology Nurse Navigator Carmina Miller, MD as Consulting Physician (Radiation Oncology) Creig Hines, MD as Consulting Physician (Oncology) Lemar Livings Merrily Pew, MD as Referring Physician (General Surgery) Sung Amabile, DO as Consulting Physician (General Surgery)   Name of the patient: Kristine Rose  [id number]  1956/01/28   Date of visit: 08/10/23  Diagnosis-history of left breast DCIS  Chief complaint/ Reason for visit-routine follow-up of DCIS  Heme/Onc history: Patient is a 67 year old female who underwent a screening bilateral mammogram in May 2023 which was followed by diagnostic mammogram.  Picked up a 2 cm group of amorphous calcifications in the left upper outer quadrant of the breast for which biopsy was recommended.  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  ER more than 90% positive.  Patient had a lumpectomy on 05/05/2022.  Final pathology showed a grade two 8 mm DCIS with negative margins.   Patient completed adjuvant radiation therapy and started taking letrozole in September 2023.  Patient subsequently stopped taking it a few months later due to insomnia and fatigue      Interval history-patient is doing well presently and denies any complaints.  She has chronic pea-sized palpable swelling at the site of her prior left biopsy.   ECOG PS- 1 Pain scale- 0   Review of systems- Review of Systems  Constitutional:  Negative for chills, fever, malaise/fatigue and weight loss.  HENT:  Negative for congestion, ear discharge and nosebleeds.   Eyes:  Negative for blurred vision.  Respiratory:  Negative for cough, hemoptysis, sputum production, shortness of breath and wheezing.   Cardiovascular:  Negative for chest pain, palpitations, orthopnea  and claudication.  Gastrointestinal:  Negative for abdominal pain, blood in stool, constipation, diarrhea, heartburn, melena, nausea and vomiting.  Genitourinary:  Negative for dysuria, flank pain, frequency, hematuria and urgency.  Musculoskeletal:  Negative for back pain, joint pain and myalgias.  Skin:  Negative for rash.  Neurological:  Negative for dizziness, tingling, focal weakness, seizures, weakness and headaches.  Endo/Heme/Allergies:  Does not bruise/bleed easily.  Psychiatric/Behavioral:  Negative for depression and suicidal ideas. The patient does not have insomnia.       Allergies  Allergen Reactions   Latex Hives    Redness, itching   Pravastatin     Other reaction(s): Other (See Comments) Joint pain     Past Medical History:  Diagnosis Date   Breast cancer (HCC)    GERD (gastroesophageal reflux disease)    Hypertension    Pre-diabetes      Past Surgical History:  Procedure Laterality Date   ABDOMINAL HYSTERECTOMY     BREAST BIOPSY Left 04/11/2023   Stereo Bx, X Clip-  BENIGN BREAST TISSUE WITH MICROCALCIFICATIONS IDENTIFIED. NEGATIVE FOR ATYPIA OR MALIGNANCY.   BREAST BIOPSY Left 04/11/2023   MM LT BREAST BX W LOC DEV 1ST LESION IMAGE BX SPEC STEREO GUIDE 04/11/2023 [facility]   BREAST LUMPECTOMY Left 05/05/2022   Procedure: BREAST LUMPECTOMY;  Surgeon: Earline Mayotte, MD;  Location: [facility] ORS;  Service: General;  Laterality: Left;   BREAST LUMPECTOMY Left 05/05/2022   BREAST, LEFT; EXCISION:  - DUCTAL CARCINOMA IN SITU, INTERMEDIATE GRADE, WITH CALCIFICATIONS AND  FOCAL COMEDO TYPE NECROSIS BREAST, LEFT; EXCISION:  - DUCTAL CARCINOMA IN SITU, INTERMEDIATE GRADE, WITH CALCIFICATIONS AND  FOCAL COMEDO  TYPE NECROSISAll margins negative for DCIS      Distance from DCIS to closest margin: 5 mm      Specify closest margin: Anterior   COLONOSCOPY WITH PROPOFOL N/A 12/13/2017   Procedure: COLONOSCOPY WITH PROPOFOL;  Surgeon: Christena Deem, MD;   Location: [facility] ENDOSCOPY;  Service: Endoscopy;  Laterality: N/A;   COLONOSCOPY WITH PROPOFOL N/A 07/05/2022   Procedure: COLONOSCOPY WITH PROPOFOL;  Surgeon: Earline Mayotte, MD;  Location: [facility] ENDOSCOPY;  Service: Endoscopy;  Laterality: N/A;    Social History   Socioeconomic History   Marital status: Married    Spouse name: Not on file   Number of children: Not on file   Years of education: Not on file   Highest education level: Not on file  Occupational History   Not on file  Tobacco Use   Smoking status: Never   Smokeless tobacco: Never  Vaping Use   Vaping status: Never Used  Substance and Sexual Activity   Alcohol use: Yes    Alcohol/week: 1.0 standard drink of alcohol    Types: 1 Glasses of wine per week    Comment: occasionaly   Drug use: No   Sexual activity: Yes  Other Topics Concern   Not on file  Social History Narrative   Not on file   Social Determinants of Health   Financial Resource Strain: Low Risk  (05/28/2023)   Received from [facility]   Overall Financial Resource Strain (CARDIA)    Difficulty of Paying Living Expenses: Not very hard  Food Insecurity: No Food Insecurity (05/28/2023)   Received from [facility]   Hunger Vital Sign    Worried About Running Out of Food in the Last Year: Never true    Ran Out of Food in the Last Year: Never true  Transportation Needs: No Transportation Needs (05/28/2023)   Received from [facility] - Transportation    In the past 12 months, has lack of transportation kept you from medical appointments or from getting medications?: No    Lack of Transportation (Non-Medical): No  Physical Activity: Insufficiently Active (05/28/2023)   Received from [facility]   Exercise Vital Sign    Days of Exercise per Week: 5 days    Minutes of Exercise per Session: 20 min  Stress: No Stress Concern Present (05/28/2023)   Received from [facility] - Occupational Stress Questionnaire    Feeling of Stress : Not at all  Social Connections: Unknown (05/28/2023)   Received from [facility]   Social Connection and Isolation Panel [NHANES]    Frequency of Communication with Friends and Family: Once a week    Frequency of Social Gatherings with Friends and Family: Once a week    Attends Religious Services: More than 4 times per year    Active Member of [employer] or Organizations: Yes    Attends Engineer, structural: More than 4 times per year    Marital Status: Not on file  Intimate Partner Violence: Not At Risk (08/10/2022)   Humiliation, Afraid, Rape, and Kick questionnaire    Fear of Current or Ex-Partner: No    Emotionally Abused: No    Physically Abused: No    Sexually Abused: No    Family History  Problem Relation Age of Onset   Stroke Mother    Heart disease Mother    Prostate  cancer Father    Heart disease Father    Testicular cancer Son    Breast cancer Neg Hx      Current Outpatient Medications:    aspirin EC 81 MG tablet, Take 81 mg by mouth daily., Disp: , Rfl:    carvedilol (COREG) 25 MG tablet, Take 25 mg by mouth 2 (two) times daily with a meal. Patient states taking 12.5 mg BID, Disp: , Rfl:    gabapentin (NEURONTIN) 300 MG capsule, Take 300 mg by mouth at bedtime., Disp: , Rfl:    hydrochlorothiazide (HYDRODIURIL) 25 MG tablet, Take 25 mg by mouth daily. Patient states taking PRN, Disp: , Rfl:    CALCIUM-VITAMIN D PO, Take by mouth. (Patient not taking: Reported on 02/19/2023), Disp: , Rfl:    letrozole (FEMARA) 2.5 MG tablet, TAKE 1 TABLET BY MOUTH EVERY DAY (Patient not taking: Reported on 02/02/2023), Disp: 90 tablet, Rfl: 0  Physical exam:  Vitals:   08/10/23 1019  BP: (!) 117/90  Pulse: 74  Resp: 17  Temp: (!) 96.5 F (35.8 C)  TempSrc: Tympanic  SpO2: 97%  Weight: 225 lb 9.6 oz (102.3 kg)  Height: 5\' 7"  (1.702  m)   Physical Exam Cardiovascular:     Rate and Rhythm: Normal rate and regular rhythm.     Heart sounds: Normal heart sounds.  Pulmonary:     Effort: Pulmonary effort is normal.     Breath sounds: Normal breath sounds.  Skin:    General: Skin is warm and dry.  Neurological:     Mental Status: She is alert and oriented to person, place, and time.   Breast exam:Breast exam was performed in seated and lying down position. Patient is status post left lumpectomy with a well-healed surgical scar.  There is a chronic pea-sized palpable mass noted in the left areola at the 7 o'clock position.  No evidence of axillary adenopathy. No evidence of any palpable masses or lumps in the right breast. No evidence of right axillary adenopathy      Latest Ref Rng & Units 05/15/2023    1:14 PM  CMP  Glucose 70 - 99 mg/dL 073   BUN 8 - 23 mg/dL 21   Creatinine 7.10 - 1.00 mg/dL 6.26   Sodium 948 - 546 mmol/L 141   Potassium 3.5 - 5.1 mmol/L 3.6   Chloride 98 - 111 mmol/L 104   CO2 22 - 32 mmol/L 30   Calcium 8.9 - 10.3 mg/dL 9.3       Latest Ref Rng & Units 05/15/2023    1:15 PM  CBC  WBC 4.0 - 10.5 K/uL 8.6   Hemoglobin 12.0 - 15.0 g/dL 27.0   Hematocrit 35.0 - 46.0 % 42.0   Platelets 150 - 400 K/uL 255     No images are attached to the encounter.  No results found.   Assessment and plan- Patient is a 67 y.o. female with left breast DCIS ER positive here for routine follow-up  Patient was started on letrozole in September 2023 following lumpectomy and adjuvant radiation therapy for DCIS.  She could not tolerate it well due to insomnia and fatigue and subsequently stopped taking it.  As per the [facility] DCIS algorithm her absolute benefit from endocrine therapy with regards to 5-year risk went 10-year risk of recurrence is about 2 and 3% respectively.  Patient therefore does not wish to do any alternate endocrine therapy at this time.  I will see her in February 2025 and  following that I  will see her on a yearly basis. Patient has a pea-sized palpable nodule in her left areola which was subsequently looked at with an ultrasound as well and was thought to be consistent with postbiopsy changes she even had biopsy of that area which was consistent with benign breast tissue with microcalcifications.  Continue to monitor   Visit Diagnosis 1. Encounter for follow-up surveillance of ductal carcinoma in situ (DCIS) of breast      Dr. Owens Shark, MD, MPH [facility] at [facility] [phone] 08/10/2023 2:09 PM

## 2023-08-23 ENCOUNTER — Encounter: Payer: Self-pay | Admitting: Radiation Oncology

## 2023-08-23 ENCOUNTER — Ambulatory Visit
Admission: RE | Admit: 2023-08-23 | Discharge: 2023-08-23 | Disposition: A | Discharge status: Home / Self Care | Payer: Medicare HMO | Source: Ambulatory Visit | Attending: Radiation Oncology | Admitting: Radiation Oncology

## 2023-08-23 VITALS — BP 138/88 | HR 71 | Temp 98.0°F | Resp 16 | Wt 238.0 lb

## 2023-08-23 DIAGNOSIS — D0512 Intraductal carcinoma in situ of left breast: Secondary | ICD-10-CM | POA: Insufficient documentation

## 2023-08-23 DIAGNOSIS — Z170 Estrogen receptor positive status [ER+]: Secondary | ICD-10-CM | POA: Diagnosis not present

## 2023-08-23 DIAGNOSIS — Z923 Personal history of irradiation: Secondary | ICD-10-CM | POA: Diagnosis not present

## 2023-08-23 DIAGNOSIS — R200 Anesthesia of skin: Secondary | ICD-10-CM | POA: Diagnosis not present

## 2023-08-23 DIAGNOSIS — M62838 Other muscle spasm: Secondary | ICD-10-CM | POA: Diagnosis not present

## 2023-08-23 NOTE — Progress Notes (Signed)
Radiation Oncology Follow up Note  Name: Kristine Rose   Date:   08/23/2023 MRN:  [id number] DOB: Dec 24, 1955    This 67 y.o. female presents to the clinic today for 67-month follow-up status post whole breast radiation to her left breast for ER positive ductal carcinoma in situ.  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 She is currently off letrozole due to side effects. The patient reports a palpable, pea-sized nodule, which is tender but not causing any issues.  The patient also reports muscle spasms and cramping in the left shoulder and back, which she attributes to favoring the left side due to previous immobilization. She has not had x-rays of the left shoulder. She also reports numbness and tingling in the hands and feet, which her GP attributed to sciatica..  COMPLICATIONS OF TREATMENT: none  FOLLOW UP COMPLIANCE: keeps appointments   PHYSICAL EXAM:  BP 138/88   Pulse 71   Temp 98 F (36.7 C) (Tympanic)   Resp 16   Wt 238 lb (108 kg)   BMI 37.28 kg/m  Lungs are clear to A&P cardiac examination essentially unremarkable with regular rate and rhythm. No dominant mass or nodularity is noted in either breast in 2 positions examined. Incision is well-healed. No axillary or supraclavicular adenopathy is appreciated. Cosmetic result is excellent.  Well-developed well-nourished patient in NAD. HEENT reveals PERLA, EOMI, discs not visualized.  Oral cavity is clear. No oral mucosal lesions are identified. Neck is clear without evidence of cervical or supraclavicular adenopathy. Lungs are clear to A&P. Cardiac examination is essentially unremarkable with regular rate and rhythm without murmur rub or thrill. Abdomen is benign with no organomegaly or masses noted. Motor sensory and DTR levels are equal and symmetric  in the upper and lower extremities. Cranial nerves II through XII are grossly intact. Proprioception is intact. No peripheral adenopathy or edema is identified. No motor or sensory levels are noted. Crude visual fields are within normal range.  RADIOLOGY RESULTS: Mammograms reviewed compatible with above-stated findings  PLAN: Breast Cancer, ER positive ductal carcinoma in situ, status post whole breast radiation No new issues. Mammogram in August showed BI-RADS 3 probably benign. Ultrasound showed fibroglandular tissue in the six to seven o'clock retroareolar region consistent with post biopsy edema. Letrozole discontinued due to side effects. -Next mammogram in six months as per Dr. Smith Robert. -Annual follow-up with Dr. Smith Robert.  Muscle Spasms and Cramping, Left Shoulder Patient reports muscle spasms and cramping in left shoulder. No known cause identified. Possible scar tissue or atrophy from disuse. -Encouraged to continue exercising the arm.  Peripheral Neuropathy Patient reports numbness and tingling in hands and feet. No history of chemotherapy. GP diagnosed as sciatica. -No specific plan discussed.  Follow-up in one year.    Carmina Miller, MD

## 2023-10-10 ENCOUNTER — Ambulatory Visit: Payer: Self-pay | Admitting: Oncology

## 2023-11-14 DIAGNOSIS — M1288 Other specific arthropathies, not elsewhere classified, other specified site: Secondary | ICD-10-CM | POA: Diagnosis not present

## 2023-11-14 DIAGNOSIS — M9983 Other biomechanical lesions of lumbar region: Secondary | ICD-10-CM | POA: Diagnosis not present

## 2023-11-14 DIAGNOSIS — M5117 Intervertebral disc disorders with radiculopathy, lumbosacral region: Secondary | ICD-10-CM | POA: Diagnosis not present

## 2023-11-14 DIAGNOSIS — R202 Paresthesia of skin: Secondary | ICD-10-CM | POA: Diagnosis not present

## 2023-11-14 DIAGNOSIS — M48061 Spinal stenosis, lumbar region without neurogenic claudication: Secondary | ICD-10-CM | POA: Diagnosis not present

## 2023-11-14 DIAGNOSIS — M5116 Intervertebral disc disorders with radiculopathy, lumbar region: Secondary | ICD-10-CM | POA: Diagnosis not present

## 2023-11-19 DIAGNOSIS — R739 Hyperglycemia, unspecified: Secondary | ICD-10-CM | POA: Diagnosis not present

## 2023-11-19 DIAGNOSIS — E538 Deficiency of other specified B group vitamins: Secondary | ICD-10-CM | POA: Diagnosis not present

## 2023-11-19 DIAGNOSIS — I10 Essential (primary) hypertension: Secondary | ICD-10-CM | POA: Diagnosis not present

## 2023-12-05 DIAGNOSIS — Z780 Asymptomatic menopausal state: Secondary | ICD-10-CM | POA: Diagnosis not present

## 2023-12-05 DIAGNOSIS — E782 Mixed hyperlipidemia: Secondary | ICD-10-CM | POA: Diagnosis not present

## 2023-12-05 DIAGNOSIS — E1169 Type 2 diabetes mellitus with other specified complication: Secondary | ICD-10-CM | POA: Diagnosis not present

## 2023-12-05 DIAGNOSIS — Z170 Estrogen receptor positive status [ER+]: Secondary | ICD-10-CM | POA: Diagnosis not present

## 2023-12-05 DIAGNOSIS — Z0000 Encounter for general adult medical examination without abnormal findings: Secondary | ICD-10-CM | POA: Diagnosis not present

## 2023-12-05 DIAGNOSIS — M5116 Intervertebral disc disorders with radiculopathy, lumbar region: Secondary | ICD-10-CM | POA: Diagnosis not present

## 2023-12-05 DIAGNOSIS — C50412 Malignant neoplasm of upper-outer quadrant of left female breast: Secondary | ICD-10-CM | POA: Diagnosis not present

## 2023-12-06 DIAGNOSIS — M48062 Spinal stenosis, lumbar region with neurogenic claudication: Secondary | ICD-10-CM | POA: Diagnosis not present

## 2023-12-06 DIAGNOSIS — M5416 Radiculopathy, lumbar region: Secondary | ICD-10-CM | POA: Diagnosis not present

## 2023-12-19 ENCOUNTER — Encounter: Payer: Self-pay | Admitting: *Deleted

## 2023-12-19 ENCOUNTER — Inpatient Hospital Stay: Payer: Self-pay | Attending: Oncology | Admitting: Oncology

## 2023-12-19 ENCOUNTER — Encounter: Payer: Self-pay | Admitting: Oncology

## 2023-12-19 VITALS — BP 107/67 | HR 69 | Temp 96.2°F | Resp 18 | Wt 221.0 lb

## 2023-12-19 DIAGNOSIS — M5416 Radiculopathy, lumbar region: Secondary | ICD-10-CM | POA: Diagnosis not present

## 2023-12-19 DIAGNOSIS — Z86000 Personal history of in-situ neoplasm of breast: Secondary | ICD-10-CM

## 2023-12-19 DIAGNOSIS — Z08 Encounter for follow-up examination after completed treatment for malignant neoplasm: Secondary | ICD-10-CM

## 2023-12-19 DIAGNOSIS — M48062 Spinal stenosis, lumbar region with neurogenic claudication: Secondary | ICD-10-CM | POA: Diagnosis not present

## 2023-12-19 DIAGNOSIS — D0512 Intraductal carcinoma in situ of left breast: Secondary | ICD-10-CM | POA: Diagnosis not present

## 2023-12-19 NOTE — Progress Notes (Signed)
Hematology/Oncology Consult note [facility]  Telephone:([phone] Fax:(336) [phone]  Patient Care Team: Danella Penton, MD as PCP - General (Internal Medicine) Hulen Luster, RN as Oncology Nurse Navigator Carmina Miller, MD as Consulting Physician (Radiation Oncology) Creig Hines, MD as Consulting Physician (Oncology) Lemar Livings Merrily Pew, MD as Referring Physician (General Surgery) Sung Amabile, DO as Consulting Physician (General Surgery)   Name of the patient: Kristine Rose  [id number]  [birth year]   Date of visit: 12/19/23  Diagnosis-history of left breast DCIS  Chief complaint/ Reason for visit-routine follow-up of DCIS  Heme/Onc history: Patient is a 68 year old female who underwent a screening bilateral mammogram in May 2023 which was followed by diagnostic mammogram.  Picked up a 2 cm group of amorphous calcifications in the left upper outer quadrant of the breast for which biopsy was recommended.  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  ER more than 90% positive.  Patient had a lumpectomy on 05/05/2022.  Final pathology showed a grade two 8 mm DCIS with negative margins.   Patient completed adjuvant radiation therapy and started taking letrozole in September 2023.  Patient subsequently stopped taking it a few months later due to insomnia and fatigue    Interval history-she had epidural spinal injection for her back pain today.  She is unable to sit up on the examination table.  Reports having significant discomfort getting mammogram after her left lumpectomy and does not wish to go for it.  ECOG PS- 1 Pain scale- 0  Review of systems- Review of Systems  Constitutional:  Negative for chills, fever, malaise/fatigue and weight loss.  HENT:  Negative for congestion, ear discharge and nosebleeds.   Eyes:  Negative for blurred vision.  Respiratory:  Negative for cough, hemoptysis, sputum production, shortness of breath and  wheezing.   Cardiovascular:  Negative for chest pain, palpitations, orthopnea and claudication.  Gastrointestinal:  Negative for abdominal pain, blood in stool, constipation, diarrhea, heartburn, melena, nausea and vomiting.  Genitourinary:  Negative for dysuria, flank pain, frequency, hematuria and urgency.  Musculoskeletal:  Negative for back pain, joint pain and myalgias.  Skin:  Negative for rash.  Neurological:  Negative for dizziness, tingling, focal weakness, seizures, weakness and headaches.  Endo/Heme/Allergies:  Does not bruise/bleed easily.  Psychiatric/Behavioral:  Negative for depression and suicidal ideas. The patient does not have insomnia.       Allergies  Allergen Reactions   Latex Hives    Redness, itching   Pravastatin     Other reaction(s): Other (See Comments) Joint pain     Past Medical History:  Diagnosis Date   Breast cancer (HCC)    GERD (gastroesophageal reflux disease)    Hypertension    Pre-diabetes      Past Surgical History:  Procedure Laterality Date   ABDOMINAL HYSTERECTOMY     BREAST BIOPSY Left 04/11/2023   Stereo Bx, X Clip-  BENIGN BREAST TISSUE WITH MICROCALCIFICATIONS IDENTIFIED. NEGATIVE FOR ATYPIA OR MALIGNANCY.   BREAST BIOPSY Left 04/11/2023   MM LT BREAST BX W LOC DEV 1ST LESION IMAGE BX SPEC STEREO GUIDE 04/11/2023 [facility]   BREAST LUMPECTOMY Left 05/05/2022   Procedure: BREAST LUMPECTOMY;  Surgeon: Earline Mayotte, MD;  Location: [facility] ORS;  Service: General;  Laterality: Left;   BREAST LUMPECTOMY Left 05/05/2022   BREAST, LEFT; EXCISION:  - DUCTAL CARCINOMA IN SITU, INTERMEDIATE GRADE, WITH CALCIFICATIONS AND  FOCAL COMEDO TYPE NECROSIS BREAST, LEFT; EXCISION:  -  DUCTAL CARCINOMA IN SITU, INTERMEDIATE GRADE, WITH CALCIFICATIONS AND  FOCAL COMEDO TYPE NECROSISAll margins negative for DCIS      Distance from DCIS to closest margin: 5 mm      Specify closest margin: Anterior   COLONOSCOPY WITH PROPOFOL N/A 12/13/2017    Procedure: COLONOSCOPY WITH PROPOFOL;  Surgeon: Christena Deem, MD;  Location: [facility] ENDOSCOPY;  Service: Endoscopy;  Laterality: N/A;   COLONOSCOPY WITH PROPOFOL N/A 07/05/2022   Procedure: COLONOSCOPY WITH PROPOFOL;  Surgeon: Earline Mayotte, MD;  Location: [facility] ENDOSCOPY;  Service: Endoscopy;  Laterality: N/A;    Social History   Socioeconomic History   Marital status: Married    Spouse name: Not on file   Number of children: Not on file   Years of education: Not on file   Highest education level: Not on file  Occupational History   Not on file  Tobacco Use   Smoking status: Never   Smokeless tobacco: Never  Vaping Use   Vaping status: Never Used  Substance and Sexual Activity   Alcohol use: Yes    Alcohol/week: 1.0 standard drink of alcohol    Types: 1 Glasses of wine per week    Comment: occasionaly   Drug use: No   Sexual activity: Yes  Other Topics Concern   Not on file  Social History Narrative   Not on file   Social Drivers of Health   Financial Resource Strain: Low Risk  (12/06/2023)   Received from [facility]   Overall Financial Resource Strain (CARDIA)    Difficulty of Paying Living Expenses: Not hard at all  Food Insecurity: No Food Insecurity (12/06/2023)   Received from [facility]   Hunger Vital Sign    Worried About Running Out of Food in the Last Year: Never true    Ran Out of Food in the Last Year: Never true  Transportation Needs: No Transportation Needs (12/06/2023)   Received from [facility]    In the past 12 months, has lack of transportation kept you from medical appointments or from getting medications?: No    Lack of Transportation (Non-Medical): No  Physical Activity: Insufficiently Active (05/28/2023)   Received from [facility]   Exercise Vital Sign    Days of Exercise per Week: 5 days    Minutes of Exercise per Session: 20 min  Stress:  No Stress Concern Present (05/28/2023)   Received from [facility] - Occupational Stress Questionnaire    Feeling of Stress : Not at all  Social Connections: Unknown (05/28/2023)   Received from [facility]   Social Connection and Isolation Panel [NHANES]    Frequency of Communication with Friends and Family: Once a week    Frequency of Social Gatherings with Friends and Family: Once a week    Attends Religious Services: More than 4 times per year    Active Member of [employer] or Organizations: Yes    Attends Engineer, structural: More than 4 times per year    Marital Status: Not on file  Intimate Partner Violence: Not At Risk (08/10/2022)   Humiliation, Afraid, Rape, and Kick questionnaire    Fear of Current or Ex-Partner: No    Emotionally Abused: No    Physically Abused: No    Sexually Abused: No    Family History  Problem Relation Age of Onset  Stroke Mother    Heart disease Mother    Prostate cancer Father    Heart disease Father    Testicular cancer Son    Breast cancer Neg Hx      Current Outpatient Medications:    diazepam (VALIUM) 5 MG tablet, 1 po 30 minutes before ESI, Disp: , Rfl:    aspirin EC 81 MG tablet, Take 81 mg by mouth daily., Disp: , Rfl:    CALCIUM-VITAMIN D PO, Take by mouth., Disp: , Rfl:    carvedilol (COREG) 25 MG tablet, Take 25 mg by mouth 2 (two) times daily with a meal. Patient states taking 12.5 mg BID, Disp: , Rfl:    gabapentin (NEURONTIN) 300 MG capsule, Take 300 mg by mouth at bedtime., Disp: , Rfl:    hydrochlorothiazide (HYDRODIURIL) 25 MG tablet, Take 25 mg by mouth daily. Patient states taking PRN, Disp: , Rfl:    letrozole (FEMARA) 2.5 MG tablet, TAKE 1 TABLET BY MOUTH EVERY DAY (Patient not taking: Reported on 02/02/2023), Disp: 90 tablet, Rfl: 0  Physical exam:  Vitals:   12/19/23 1010  BP: 107/67  Pulse: 69  Resp: 18  Temp: (!) 96.2 F (35.7 C)   TempSrc: Tympanic  SpO2: 98%  Weight: 221 lb (100.2 kg)   Physical Exam Constitutional:      Comments: Sitting in a wheelchair and appears in no acute distress  Cardiovascular:     Rate and Rhythm: Normal rate and regular rhythm.     Heart sounds: Normal heart sounds.  Pulmonary:     Effort: Pulmonary effort is normal.     Breath sounds: Normal breath sounds.  Skin:    General: Skin is warm and dry.  Neurological:     Mental Status: She is alert and oriented to person, place, and time.    Breast exam was performed in seated and lying down position. Patient is status post left lumpectomy with a well-healed surgical scar. No evidence of any palpable masses. No evidence of axillary adenopathy. No evidence of any palpable masses or lumps in the right breast. No evidence of right axillary adenopathy      Latest Ref Rng & Units 05/15/2023    1:14 PM  CMP  Glucose 70 - 99 mg/dL 409   BUN 8 - 23 mg/dL 21   Creatinine 8.11 - 1.00 mg/dL 9.14   Sodium 782 - 956 mmol/L 141   Potassium 3.5 - 5.1 mmol/L 3.6   Chloride 98 - 111 mmol/L 104   CO2 22 - 32 mmol/L 30   Calcium 8.9 - 10.3 mg/dL 9.3       Latest Ref Rng & Units 05/15/2023    1:15 PM  CBC  WBC 4.0 - 10.5 K/uL 8.6   Hemoglobin 12.0 - 15.0 g/dL 21.3   Hematocrit 08.6 - 46.0 % 42.0   Platelets 150 - 400 K/uL 255      Assessment and plan- Patient is a 68 y.o. female with history of left breast DCIS status postlumpectomy and adjuvant radiation therapy here for routine follow-up  Breast exam was somewhat limited today as patient is unable to sit up on the examination table following her epidural injection for her back.  Clinically she is doing well with no concerning signs and symptoms of recurrence based on today's exam.  We will get in touch with radiology as to what we can do for her surveillance imaging given that she does not wish to go through left breast mammogram in  the future.  She states that she has significant pain  during the procedure well postlumpectomy.  I will see her back in 1 year no labs   Visit Diagnosis 1. Encounter for follow-up surveillance of ductal carcinoma in situ (DCIS) of breast      Dr. Owens Shark, MD, MPH [facility] at [facility] [phone] 12/19/2023 10:46 AM

## 2023-12-24 DIAGNOSIS — M5442 Lumbago with sciatica, left side: Secondary | ICD-10-CM | POA: Diagnosis not present

## 2023-12-24 DIAGNOSIS — M5441 Lumbago with sciatica, right side: Secondary | ICD-10-CM | POA: Diagnosis not present

## 2023-12-24 DIAGNOSIS — G8929 Other chronic pain: Secondary | ICD-10-CM | POA: Diagnosis not present

## 2023-12-31 DIAGNOSIS — G8929 Other chronic pain: Secondary | ICD-10-CM | POA: Diagnosis not present

## 2023-12-31 DIAGNOSIS — M5441 Lumbago with sciatica, right side: Secondary | ICD-10-CM | POA: Diagnosis not present

## 2023-12-31 DIAGNOSIS — M5442 Lumbago with sciatica, left side: Secondary | ICD-10-CM | POA: Diagnosis not present

## 2024-01-07 DIAGNOSIS — M5442 Lumbago with sciatica, left side: Secondary | ICD-10-CM | POA: Diagnosis not present

## 2024-01-07 DIAGNOSIS — M5441 Lumbago with sciatica, right side: Secondary | ICD-10-CM | POA: Diagnosis not present

## 2024-01-07 DIAGNOSIS — G8929 Other chronic pain: Secondary | ICD-10-CM | POA: Diagnosis not present

## 2024-01-09 DIAGNOSIS — H2513 Age-related nuclear cataract, bilateral: Secondary | ICD-10-CM | POA: Diagnosis not present

## 2024-01-09 DIAGNOSIS — M5416 Radiculopathy, lumbar region: Secondary | ICD-10-CM | POA: Diagnosis not present

## 2024-01-09 DIAGNOSIS — M48062 Spinal stenosis, lumbar region with neurogenic claudication: Secondary | ICD-10-CM | POA: Diagnosis not present

## 2024-01-09 DIAGNOSIS — H02885 Meibomian gland dysfunction left lower eyelid: Secondary | ICD-10-CM | POA: Diagnosis not present

## 2024-01-09 DIAGNOSIS — H02882 Meibomian gland dysfunction right lower eyelid: Secondary | ICD-10-CM | POA: Diagnosis not present

## 2024-01-14 DIAGNOSIS — M5442 Lumbago with sciatica, left side: Secondary | ICD-10-CM | POA: Diagnosis not present

## 2024-01-14 DIAGNOSIS — M5441 Lumbago with sciatica, right side: Secondary | ICD-10-CM | POA: Diagnosis not present

## 2024-01-14 DIAGNOSIS — G8929 Other chronic pain: Secondary | ICD-10-CM | POA: Diagnosis not present

## 2024-01-18 DIAGNOSIS — E782 Mixed hyperlipidemia: Secondary | ICD-10-CM | POA: Diagnosis not present

## 2024-01-18 DIAGNOSIS — M5116 Intervertebral disc disorders with radiculopathy, lumbar region: Secondary | ICD-10-CM | POA: Diagnosis not present

## 2024-01-18 DIAGNOSIS — E1169 Type 2 diabetes mellitus with other specified complication: Secondary | ICD-10-CM | POA: Diagnosis not present

## 2024-01-21 IMAGING — MG MM BREAST LOCALIZATION CLIP
4 series · 4 of 12 positions shown · non-contrast
Comparison: Previous exam(s).

CLINICAL DATA: Status post stereotactic guided biopsy

EXAM:
3D DIAGNOSTIC LEFT MAMMOGRAM POST STEREOTACTIC BIOPSY

[L CC synth-2D]
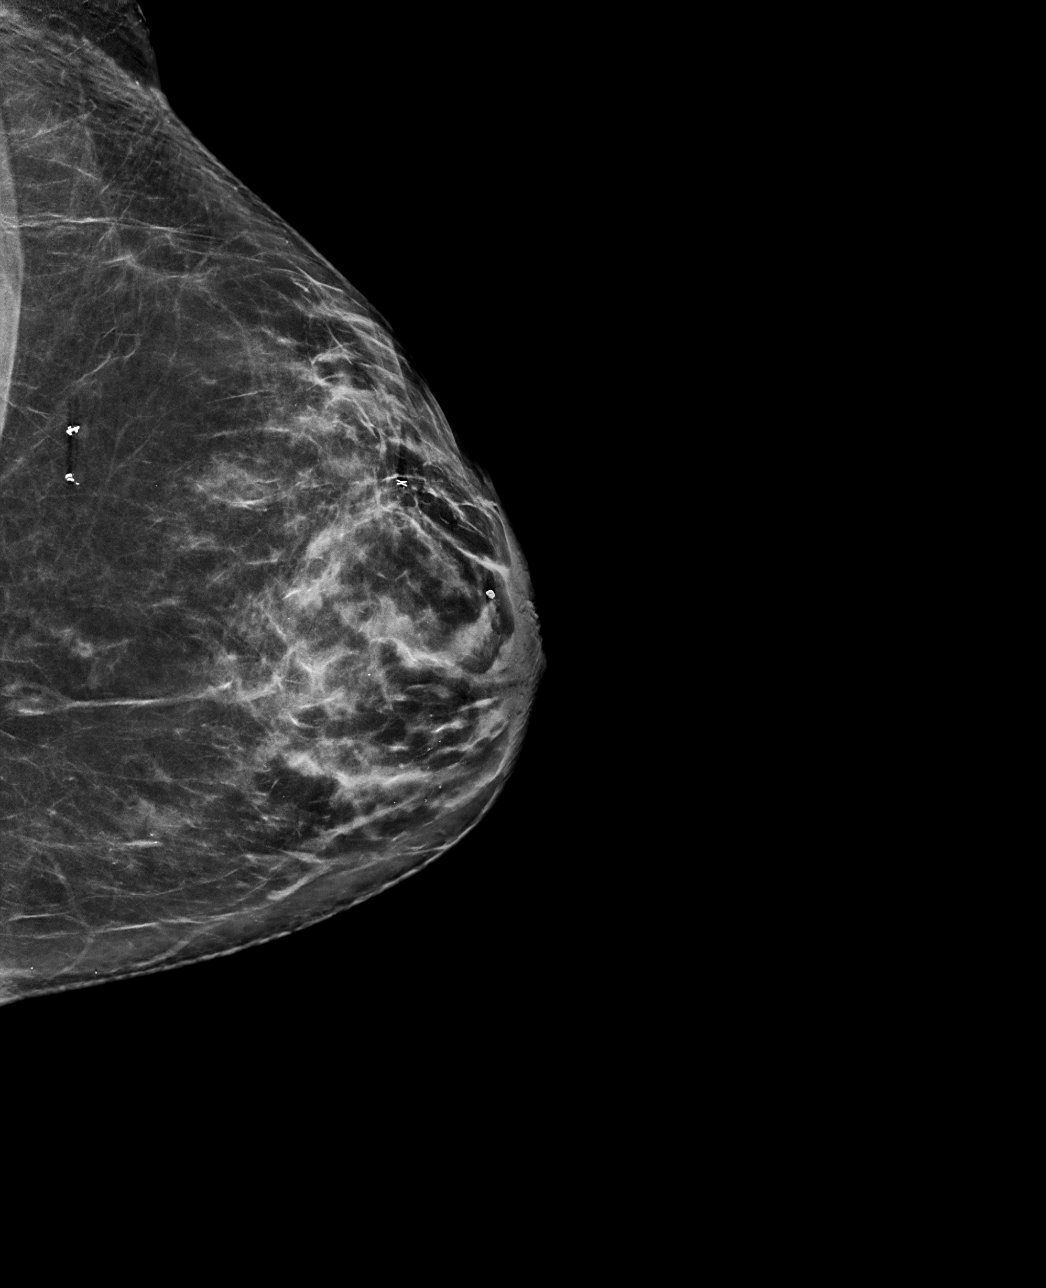

[L ML synth-2D]
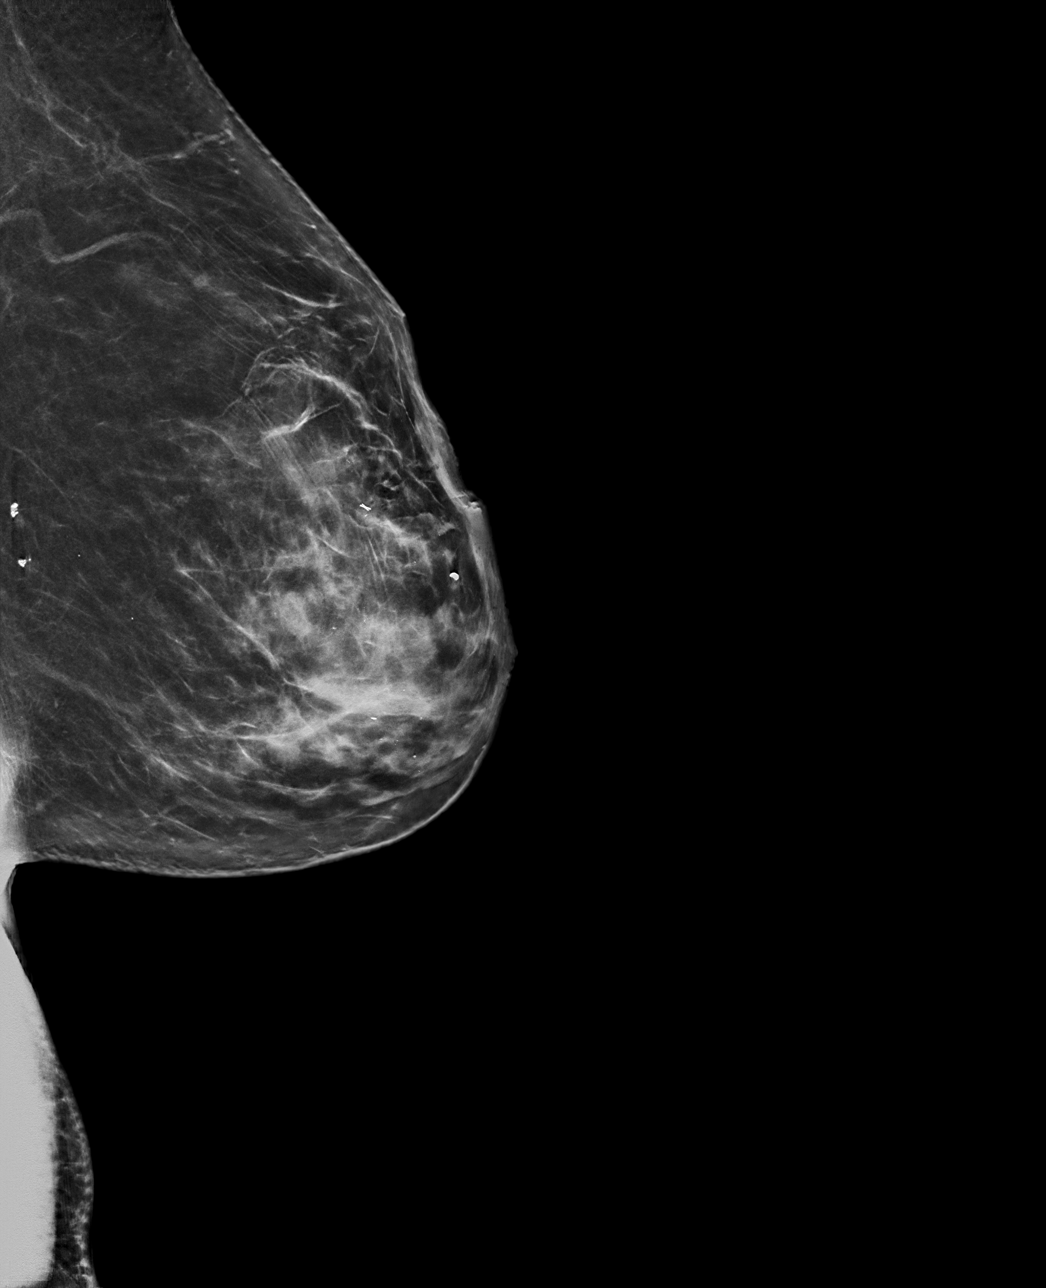

[L ML tomo · tomo slice 42/83.0]
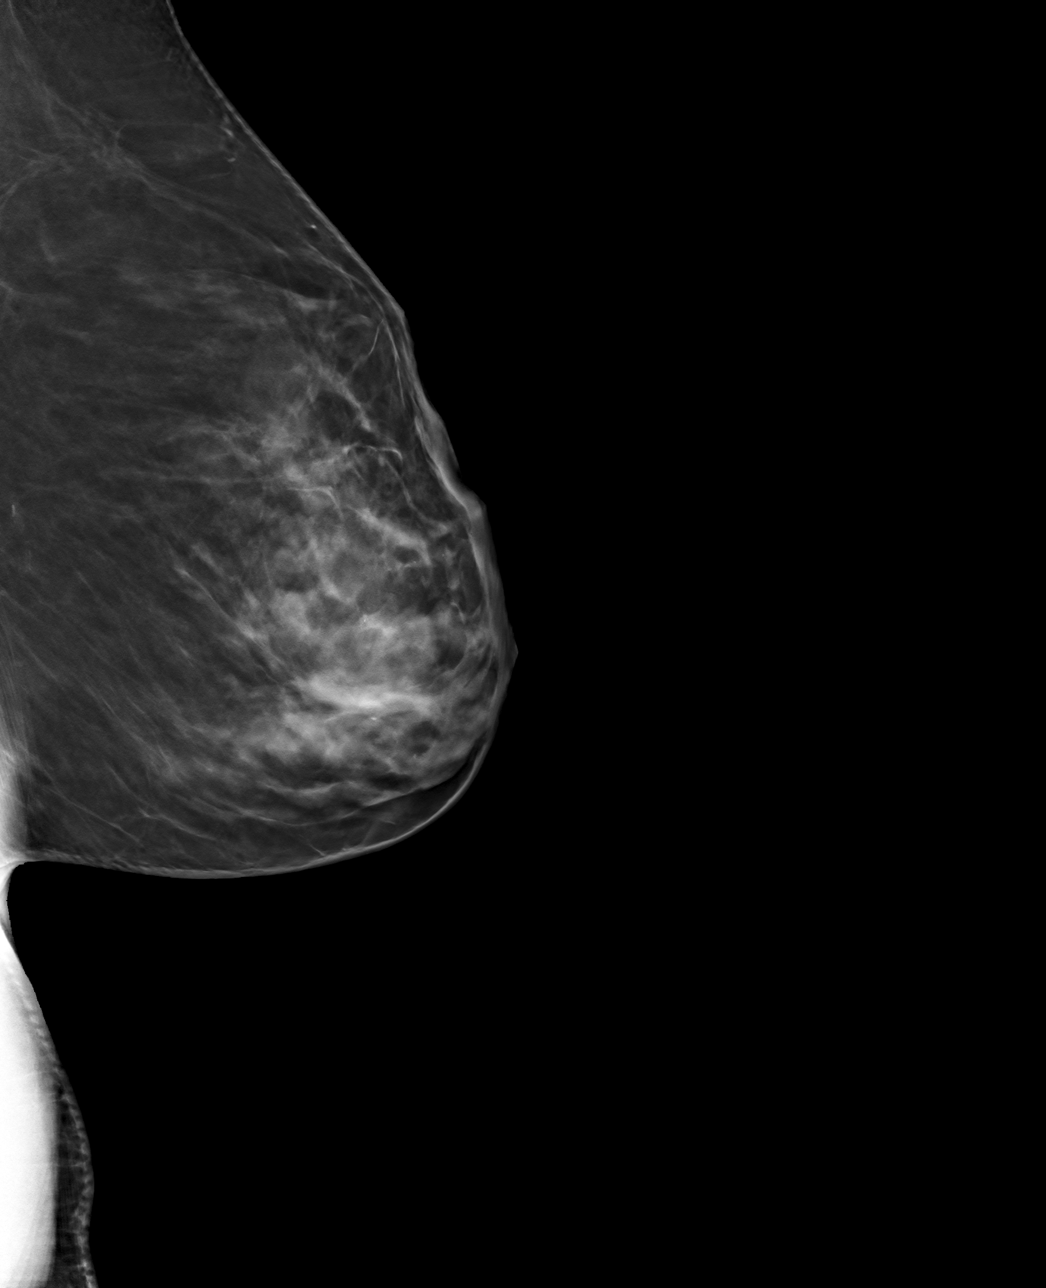

[L CC tomo · tomo slice 39/76.0]
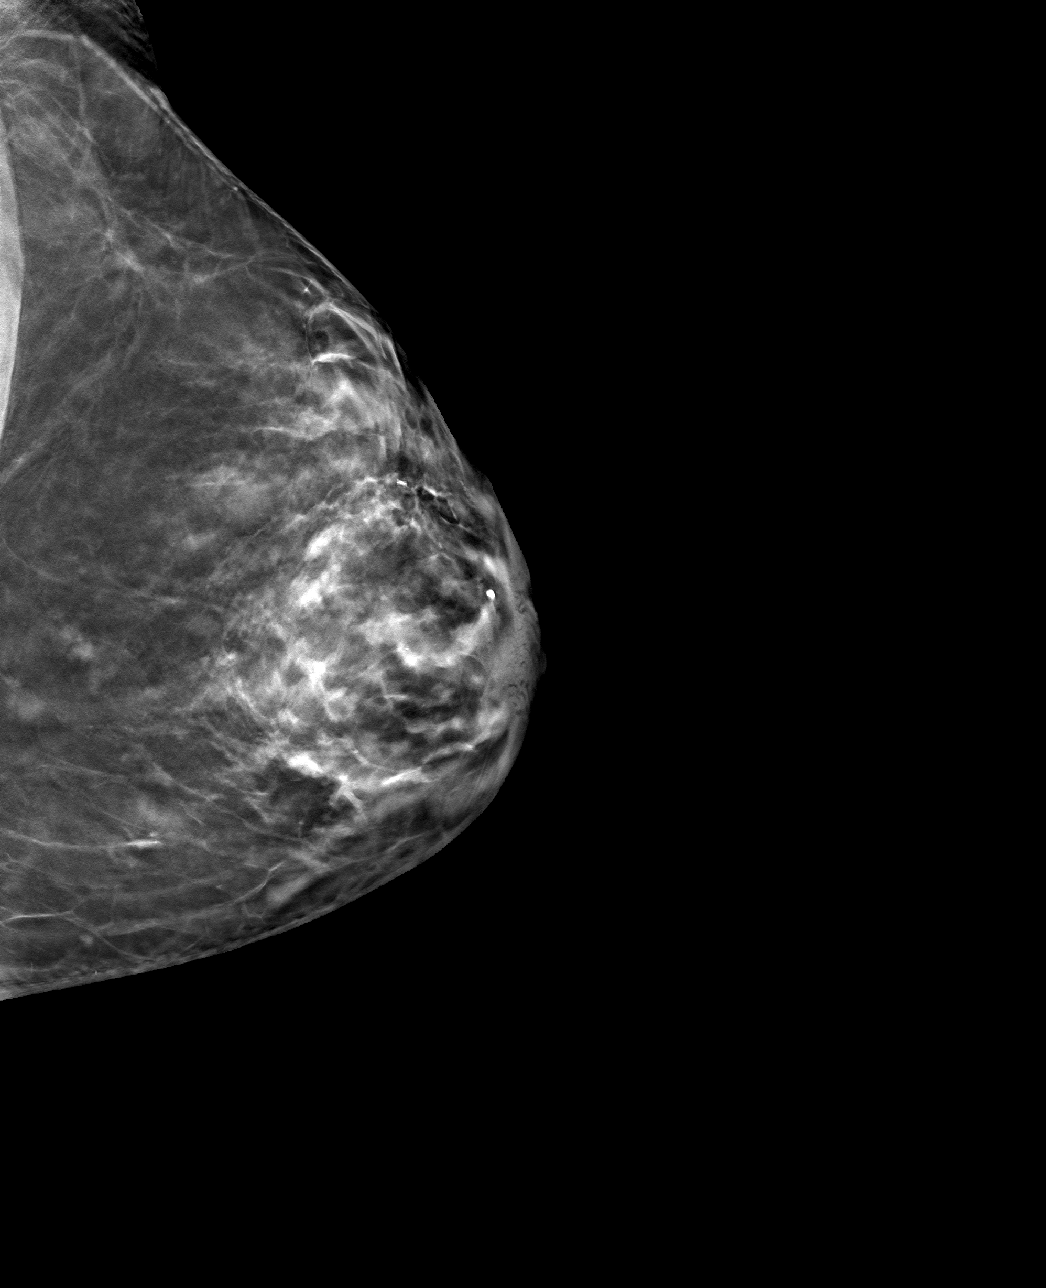

[4 of 12 positions shown; findings below may reference images not displayed]

FINDINGS: 3D Mammographic images were obtained following stereotactic guided
biopsy of indeterminate calcifications. The X shaped biopsy marking
clip is in expected position at the site of biopsy.
IMPRESSION: Appropriate positioning of the X shaped biopsy marking clip at the
site of biopsy in the upper outer breast at anterior depth.

Final Assessment: Post Procedure Mammograms for Marker Placement

## 2024-01-23 DIAGNOSIS — G8929 Other chronic pain: Secondary | ICD-10-CM | POA: Diagnosis not present

## 2024-01-23 DIAGNOSIS — M5442 Lumbago with sciatica, left side: Secondary | ICD-10-CM | POA: Diagnosis not present

## 2024-01-23 DIAGNOSIS — M5441 Lumbago with sciatica, right side: Secondary | ICD-10-CM | POA: Diagnosis not present

## 2024-01-24 DIAGNOSIS — R202 Paresthesia of skin: Secondary | ICD-10-CM | POA: Diagnosis not present

## 2024-01-28 DIAGNOSIS — M48062 Spinal stenosis, lumbar region with neurogenic claudication: Secondary | ICD-10-CM | POA: Diagnosis not present

## 2024-01-28 DIAGNOSIS — M5416 Radiculopathy, lumbar region: Secondary | ICD-10-CM | POA: Diagnosis not present

## 2024-02-01 ENCOUNTER — Ambulatory Visit: Payer: BC Managed Care – PPO | Admitting: Oncology

## 2024-02-25 DIAGNOSIS — M5416 Radiculopathy, lumbar region: Secondary | ICD-10-CM | POA: Diagnosis not present

## 2024-02-25 DIAGNOSIS — M48062 Spinal stenosis, lumbar region with neurogenic claudication: Secondary | ICD-10-CM | POA: Diagnosis not present

## 2024-04-01 DIAGNOSIS — M47816 Spondylosis without myelopathy or radiculopathy, lumbar region: Secondary | ICD-10-CM | POA: Diagnosis not present

## 2024-04-01 DIAGNOSIS — Z6834 Body mass index (BMI) 34.0-34.9, adult: Secondary | ICD-10-CM | POA: Diagnosis not present

## 2024-04-02 ENCOUNTER — Ambulatory Visit
Admission: RE | Admit: 2024-04-02 | Discharge: 2024-04-02 | Disposition: A | Discharge status: Home / Self Care | Source: Ambulatory Visit | Attending: Oncology | Admitting: Oncology

## 2024-04-02 ENCOUNTER — Other Ambulatory Visit: Payer: Self-pay | Admitting: Neurosurgery

## 2024-04-02 DIAGNOSIS — Z08 Encounter for follow-up examination after completed treatment for malignant neoplasm: Secondary | ICD-10-CM

## 2024-04-02 DIAGNOSIS — Z86000 Personal history of in-situ neoplasm of breast: Secondary | ICD-10-CM | POA: Insufficient documentation

## 2024-04-02 DIAGNOSIS — M47816 Spondylosis without myelopathy or radiculopathy, lumbar region: Secondary | ICD-10-CM

## 2024-04-07 NOTE — Discharge Instructions (Signed)

## 2024-04-09 ENCOUNTER — Other Ambulatory Visit

## 2024-04-09 ENCOUNTER — Inpatient Hospital Stay
Admission: RE | Admit: 2024-04-09 | Discharge: 2024-04-09 | Disposition: A | Discharge status: Home / Self Care | Source: Ambulatory Visit | Attending: Neurosurgery | Admitting: Neurosurgery

## 2024-04-10 NOTE — Discharge Instructions (Signed)

## 2024-04-14 ENCOUNTER — Ambulatory Visit
Admission: RE | Admit: 2024-04-14 | Discharge: 2024-04-14 | Disposition: A | Discharge status: Home / Self Care | Source: Ambulatory Visit | Attending: Neurosurgery | Admitting: Neurosurgery

## 2024-04-14 DIAGNOSIS — M48061 Spinal stenosis, lumbar region without neurogenic claudication: Secondary | ICD-10-CM | POA: Diagnosis not present

## 2024-04-14 DIAGNOSIS — M51362 Other intervertebral disc degeneration, lumbar region with discogenic back pain and lower extremity pain: Secondary | ICD-10-CM | POA: Diagnosis not present

## 2024-04-14 DIAGNOSIS — M4807 Spinal stenosis, lumbosacral region: Secondary | ICD-10-CM | POA: Diagnosis not present

## 2024-04-14 DIAGNOSIS — M47816 Spondylosis without myelopathy or radiculopathy, lumbar region: Secondary | ICD-10-CM

## 2024-04-14 MED ORDER — DIAZEPAM 5 MG PO TABS: 5.0000 mg | ORAL_TABLET | Freq: Once | ORAL | Status: AC

## 2024-04-14 MED ORDER — IOPAMIDOL (ISOVUE-M 200) INJECTION 41%: 20.0000 mL | Freq: Once | INTRAMUSCULAR | Status: AC

## 2024-04-14 MED ORDER — ONDANSETRON HCL 4 MG/2ML IJ SOLN: 4.0000 mg | Freq: Once | INTRAMUSCULAR | Status: DC | PRN

## 2024-04-14 MED ORDER — MEPERIDINE HCL 50 MG/ML IJ SOLN: 50.0000 mg | Freq: Once | INTRAMUSCULAR | Status: DC | PRN

## 2024-05-08 DIAGNOSIS — M8588 Other specified disorders of bone density and structure, other site: Secondary | ICD-10-CM | POA: Diagnosis not present

## 2024-05-23 DIAGNOSIS — R200 Anesthesia of skin: Secondary | ICD-10-CM | POA: Diagnosis not present

## 2024-05-23 DIAGNOSIS — R202 Paresthesia of skin: Secondary | ICD-10-CM | POA: Diagnosis not present

## 2024-05-23 DIAGNOSIS — Z6833 Body mass index (BMI) 33.0-33.9, adult: Secondary | ICD-10-CM | POA: Diagnosis not present

## 2024-05-23 DIAGNOSIS — M47816 Spondylosis without myelopathy or radiculopathy, lumbar region: Secondary | ICD-10-CM | POA: Diagnosis not present

## 2024-05-26 DIAGNOSIS — E782 Mixed hyperlipidemia: Secondary | ICD-10-CM | POA: Diagnosis not present

## 2024-05-26 DIAGNOSIS — E1169 Type 2 diabetes mellitus with other specified complication: Secondary | ICD-10-CM | POA: Diagnosis not present

## 2024-06-03 DIAGNOSIS — E039 Hypothyroidism, unspecified: Secondary | ICD-10-CM | POA: Diagnosis not present

## 2024-06-03 DIAGNOSIS — I2089 Other forms of angina pectoris: Secondary | ICD-10-CM | POA: Diagnosis not present

## 2024-06-03 DIAGNOSIS — E782 Mixed hyperlipidemia: Secondary | ICD-10-CM | POA: Diagnosis not present

## 2024-06-03 DIAGNOSIS — R7989 Other specified abnormal findings of blood chemistry: Secondary | ICD-10-CM | POA: Diagnosis not present

## 2024-06-03 DIAGNOSIS — G609 Hereditary and idiopathic neuropathy, unspecified: Secondary | ICD-10-CM | POA: Diagnosis not present

## 2024-06-03 DIAGNOSIS — E1169 Type 2 diabetes mellitus with other specified complication: Secondary | ICD-10-CM | POA: Diagnosis not present

## 2024-06-03 DIAGNOSIS — M519 Unspecified thoracic, thoracolumbar and lumbosacral intervertebral disc disorder: Secondary | ICD-10-CM | POA: Diagnosis not present

## 2024-06-24 DIAGNOSIS — I2089 Other forms of angina pectoris: Secondary | ICD-10-CM | POA: Diagnosis not present

## 2024-06-26 ENCOUNTER — Ambulatory Visit
Admission: RE | Admit: 2024-06-26 | Discharge: 2024-06-26 | Disposition: A | Discharge status: Home / Self Care | Source: Ambulatory Visit | Attending: Oncology

## 2024-06-26 ENCOUNTER — Ambulatory Visit
Admission: RE | Admit: 2024-06-26 | Discharge: 2024-06-26 | Disposition: A | Discharge status: Home / Self Care | Source: Ambulatory Visit | Attending: Oncology | Admitting: Oncology

## 2024-06-26 DIAGNOSIS — Z08 Encounter for follow-up examination after completed treatment for malignant neoplasm: Secondary | ICD-10-CM | POA: Diagnosis not present

## 2024-06-26 DIAGNOSIS — Z86000 Personal history of in-situ neoplasm of breast: Secondary | ICD-10-CM | POA: Diagnosis not present

## 2024-06-27 DIAGNOSIS — I209 Angina pectoris, unspecified: Secondary | ICD-10-CM | POA: Diagnosis not present

## 2024-06-27 DIAGNOSIS — R0602 Shortness of breath: Secondary | ICD-10-CM | POA: Diagnosis not present

## 2024-06-27 DIAGNOSIS — I10 Essential (primary) hypertension: Secondary | ICD-10-CM | POA: Diagnosis not present

## 2024-06-27 DIAGNOSIS — R0789 Other chest pain: Secondary | ICD-10-CM | POA: Diagnosis not present

## 2024-06-27 DIAGNOSIS — R9439 Abnormal result of other cardiovascular function study: Secondary | ICD-10-CM | POA: Diagnosis not present

## 2024-06-27 DIAGNOSIS — Z01818 Encounter for other preprocedural examination: Secondary | ICD-10-CM | POA: Diagnosis not present

## 2024-06-27 NOTE — Progress Notes (Signed)
 Chief Complaint  Patient presents with  . Chest Pain    Occas x 1-2 months   . Shortness of Breath    Patient stated at night when resting and with exertion   . Dizziness    Patient stated very random   . Palpitations    Patient stated random   . Tingling    BIL in feet   . Burning in Feet    BIL in feet     Subjective  Kristine Rose is a 68 y.o. female who presents for Chest Pain (Occas x 1-2 months ), Shortness of Breath (Patient stated at night when resting and with exertion ), Dizziness (Patient stated very random ), Palpitations (Patient stated random ), Tingling (BIL in feet ), and Burning in Feet (BIL in feet/) Chest Pain  Associated symptoms include dizziness, palpitations and shortness of breath.  Shortness of Breath Associated symptoms include chest pain.  Dizziness Associated symptoms include chest pain.  Palpitations  Associated symptoms include chest pain, dizziness and shortness of breath.   History of Present Illness Kristine Rose is a 68 year old female who presents with chest pain, shortness of breath, and dizziness. She was referred by Dr. Cleotilde for evaluation of abnormal exercise stress test results and symptoms of chest pain, shortness of breath, and dizziness.  She experiences chest pain located between her shoulder blades, radiating to her chest. The pain is associated with stress. She recalls a previous myelogram that showed plaque in her aorta and gallstones, but she was reassured that gallstones are common and not necessarily related to her current symptoms.  She has experienced episodes of high blood pressure, with a notable instance in July where her blood pressure reached 211/115 mmHg, accompanied by a heart rate of 211 beats per minute. She initially thought this was a gallbladder attack but now questions its relation to her cardiac symptoms. She monitors her blood pressure and heart rate using an app, which has occasionally shown irregular heartbeats. She  is unsure if these readings are accurate but notes that they coincide with her symptoms.  She is retired but remains active, working more now than before retirement. She is cautious about her health and wants to remain healthy and active.  Review of Systems  Respiratory:  Positive for shortness of breath.   Cardiovascular:  Positive for chest pain and palpitations.  Neurological:  Positive for dizziness.    Patient Active Problem List  Diagnosis  . Combined hyperlipidemia  . Benign essential hypertension  . Low serum vitamin D  . Atypical chest pain  . Statin intolerance  . Serrated adenoma of colon  . Ductal carcinoma in situ (DCIS) of left breast  . Medicare annual wellness visit, initial  . Lumbar disc disease  . DM type 2 with diabetic mixed hyperlipidemia  (CMS/HHS-HCC)  . Idiopathic peripheral neuropathy  . Acquired hypothyroidism    Outpatient Medications Prior to Visit  Medication Sig Dispense Refill  . aspirin 81 MG EC tablet Take 81 mg by mouth once daily    . carvediloL (COREG) 12.5 MG tablet Take 1 tablet (12.5 mg total) by mouth 2 (two) times daily with meals 180 tablet 3  . cholecalciferol 1000 unit tablet Take 1,000 Units by mouth once daily    . folic acid (FOLVITE) 400 MCG tablet Take 400 mcg by mouth once daily    . hydroCHLOROthiazide (HYDRODIURIL) 25 MG tablet TAKE 1 TABLET (25 MG TOTAL) BY MOUTH ONCE DAILY AS NEEDED (FLUID) (Patient taking  differently: Take 25 mg by mouth once daily as needed (fluid) Patient takes every other day) 90 tablet 0  . levothyroxine (SYNTHROID) 75 MCG tablet Take 1 tablet (75 mcg total) by mouth once daily Take on an empty stomach with a glass of water  at least 30-60 minutes before breakfast. 90 tablet 3  . magnesium oxide (MAG-OX) 400 mg (241.3 mg magnesium) tablet Take 400 mg by mouth once daily    . vitamin B complex (B COMPLEX ORAL) Take 0.5 Syringes by mouth once daily    . VITAMIN K2 ORAL Take 0.5 Syringes by mouth once daily     . traMADoL (ULTRAM) 50 mg tablet Take 1 tablet (50 mg total) by mouth 2 (two) times daily as needed 1 po bid prn (Patient not taking: Reported on 06/27/2024) 14 tablet 0   No facility-administered medications prior to visit.      Objective  Vitals:   06/27/24 1001  BP: 110/60  Pulse: 68  Resp: 13  SpO2: 93%  Weight: 96.3 kg (212 lb 3.2 oz)  Height: 170.2 cm (5' 7)  PainSc: 1   PainLoc: Back   Body mass index is 33.24 kg/m.  Home Vitals:     Physical Exam Physical Exam   Constitutional: alert, in NAD, and communicates well Eye exam: pupils equal and reactive, extraocular eye movements intact. Neck: supple, no thyroid  enlargement or cervical adenopathy, and no bruits heard Respiratory: clear to auscultation, without rales or wheezes  Cardiovascular: regular rate and rhythm and without murmurs, rubs or gallops Lower extremities: no lower extremity edema Skin ankles/feet: warm, good capillary refill and no ulcerations or lesions noted Neurological: sensorimotor grossly intact and normal muscle tone  Results RADIOLOGY Myelogram: Plaque in aorta, gallstones  DIAGNOSTIC REPORTS Exercise stress test: Abnormal     Assessment/Plan:   Assessment & Plan Preprocedural cardiovascular evaluation for abnormal stress test with chest pain and shortness of breath Abnormal exercise stress test with symptoms of chest pain, shortness of breath, and dizziness suggestive of coronary artery disease. Differential diagnosis includes gallbladder issues, but gallbladder pain would not cause changes seen on the stress test or EKG. EKG shows baseline abnormalities, but no acute event currently. Need to rule out significant coronary artery blockage. - Schedule heart catheterization at [facility] to assess for coronary artery blockage and potential stent placement. - Advise on post-procedure care, including lifting restrictions.  Essential hypertension Elevated blood pressure readings, including a  significant episode of 211/115 mmHg. Possible irregular heartbeats noted on home monitoring, likely benign extra beats. Blood pressure management and monitoring are essential. Diagnoses and all orders for this visit:  Atypical chest pain -     ECG 12-lead  Benign essential hypertension -     ECG 12-lead  Abnormal cardiovascular stress test  Pre-op testing -     Basic Metabolic Panel (BMP) -     CBC w/auto Differential (5 Part)  SOB (shortness of breath)  Typical angina ()     Future Appointments     Date/Time Provider Department Center Visit Type   07/01/2024 9:45 AM Tye Millet, DO [facility] C RETURN 30   01/15/2025 10:00 AM [facility] C LAB   01/22/2025 11:00 AM Cleotilde Oneil Novel, MD [facility] C PHYSICAL       There are no Patient Instructions on file for this visit.  An after visit summary was provided for the patient either in written format (printed) or through [facility].  This note has been created using automated tools and reviewed for accuracy by [facility].

## 2024-07-01 DIAGNOSIS — D0512 Intraductal carcinoma in situ of left breast: Secondary | ICD-10-CM | POA: Diagnosis not present

## 2024-07-01 DIAGNOSIS — K8020 Calculus of gallbladder without cholecystitis without obstruction: Secondary | ICD-10-CM | POA: Diagnosis not present

## 2024-07-07 ENCOUNTER — Other Ambulatory Visit: Payer: Self-pay

## 2024-07-07 ENCOUNTER — Ambulatory Visit
Admission: RE | Admit: 2024-07-07 | Discharge: 2024-07-07 | Disposition: A | Discharge status: Home / Self Care | Attending: Internal Medicine | Admitting: Internal Medicine

## 2024-07-07 ENCOUNTER — Encounter
Admission: RE | Disposition: A | Discharge status: Home / Self Care | Payer: Self-pay | Source: Home / Self Care | Attending: Internal Medicine

## 2024-07-07 DIAGNOSIS — R9430 Abnormal result of cardiovascular function study, unspecified: Secondary | ICD-10-CM | POA: Diagnosis not present

## 2024-07-07 DIAGNOSIS — I25119 Atherosclerotic heart disease of native coronary artery with unspecified angina pectoris: Secondary | ICD-10-CM | POA: Insufficient documentation

## 2024-07-07 DIAGNOSIS — I10 Essential (primary) hypertension: Secondary | ICD-10-CM | POA: Insufficient documentation

## 2024-07-07 HISTORY — PX: CORONARY PRESSURE/FFR STUDY: CATH118243

## 2024-07-07 HISTORY — PX: LEFT HEART CATH AND CORONARY ANGIOGRAPHY: CATH118249

## 2024-07-07 LAB — POCT ACTIVATED CLOTTING TIME: Activated Clotting Time: 256 s

## 2024-07-07 LAB — CARDIAC CATHETERIZATION: Cath EF Quantitative: 60 %

## 2024-07-07 SURGERY — LEFT HEART CATH AND CORONARY ANGIOGRAPHY
Anesthesia: Moderate Sedation

## 2024-07-07 MED ORDER — FREE WATER: 500.0000 mL | Freq: Once | Status: DC

## 2024-07-07 MED ORDER — SODIUM CHLORIDE 0.9 % IV SOLN: 250.0000 mL | INTRAVENOUS | Status: DC | PRN

## 2024-07-07 MED ORDER — VERAPAMIL HCL 2.5 MG/ML IV SOLN
INTRAVENOUS | Status: AC
  Filled 2024-07-07: qty 2

## 2024-07-07 MED ORDER — MIDAZOLAM HCL 2 MG/2ML IJ SOLN
INTRAMUSCULAR | Status: DC | PRN
  Administered 2024-07-07: 1 mg via INTRAVENOUS

## 2024-07-07 MED ORDER — IOHEXOL 300 MG/ML  SOLN
INTRAMUSCULAR | Status: DC | PRN
  Administered 2024-07-07: 120 mL

## 2024-07-07 MED ORDER — LIDOCAINE HCL 1 % IJ SOLN
INTRAMUSCULAR | Status: AC
  Filled 2024-07-07: qty 20

## 2024-07-07 MED ORDER — HEPARIN SODIUM (PORCINE) 1000 UNIT/ML IJ SOLN
INTRAMUSCULAR | Status: AC
  Filled 2024-07-07: qty 10

## 2024-07-07 MED ORDER — ASPIRIN 81 MG PO CHEW: 81.0000 mg | CHEWABLE_TABLET | ORAL | Status: DC

## 2024-07-07 MED ORDER — FENTANYL CITRATE (PF) 100 MCG/2ML IJ SOLN
INTRAMUSCULAR | Status: DC | PRN
  Administered 2024-07-07: 25 ug via INTRAVENOUS

## 2024-07-07 MED ORDER — SODIUM CHLORIDE 0.9% FLUSH: 3.0000 mL | Freq: Two times a day (BID) | INTRAVENOUS | Status: DC

## 2024-07-07 MED ORDER — MIDAZOLAM HCL 2 MG/2ML IJ SOLN
INTRAMUSCULAR | Status: AC
  Filled 2024-07-07: qty 2

## 2024-07-07 MED ORDER — SODIUM CHLORIDE 0.9% FLUSH: 3.0000 mL | INTRAVENOUS | Status: DC | PRN

## 2024-07-07 MED ORDER — HEPARIN (PORCINE) IN NACL 1000-0.9 UT/500ML-% IV SOLN
INTRAVENOUS | Status: AC
  Filled 2024-07-07: qty 1000

## 2024-07-07 MED ORDER — VERAPAMIL HCL 2.5 MG/ML IV SOLN
INTRAVENOUS | Status: DC | PRN
  Administered 2024-07-07: 2.5 mg via INTRA_ARTERIAL

## 2024-07-07 MED ORDER — HEPARIN SODIUM (PORCINE) 1000 UNIT/ML IJ SOLN
INTRAMUSCULAR | Status: DC | PRN
  Administered 2024-07-07: 3000 [IU] via INTRAVENOUS
  Administered 2024-07-07: 4000 [IU] via INTRAVENOUS
  Administered 2024-07-07: 2000 [IU] via INTRAVENOUS

## 2024-07-07 MED ORDER — HEPARIN (PORCINE) IN NACL 1000-0.9 UT/500ML-% IV SOLN
INTRAVENOUS | Status: DC | PRN
  Administered 2024-07-07: 1000 mL

## 2024-07-07 MED ORDER — FENTANYL CITRATE (PF) 100 MCG/2ML IJ SOLN
INTRAMUSCULAR | Status: AC
  Filled 2024-07-07: qty 2

## 2024-07-07 MED ORDER — LIDOCAINE HCL (PF) 1 % IJ SOLN
INTRAMUSCULAR | Status: DC | PRN
  Administered 2024-07-07: 2 mL

## 2024-07-07 SURGICAL SUPPLY — 13 items
CATH INFINITI 5 FR JL3.5 (CATHETERS) IMPLANT
CATH INFINITI JR4 5F (CATHETERS) IMPLANT
CATH VISTA GUIDE 6FR XBLD 3.5 (CATHETERS) IMPLANT
DEVICE RAD TR BAND REGULAR (VASCULAR PRODUCTS) IMPLANT
DRAPE BRACHIAL (DRAPES) IMPLANT
GLIDESHEATH SLEND SS 6F .021 (SHEATH) IMPLANT
GUIDEWIRE INQWIRE 1.5J.035X260 (WIRE) IMPLANT
GUIDEWIRE PRESSURE X 175 (WIRE) IMPLANT
KIT ENCORE 26 ADVANTAGE (KITS) IMPLANT
PACK CARDIAC CATH (CUSTOM PROCEDURE TRAY) ×2 IMPLANT
SET ATX-X65L (MISCELLANEOUS) IMPLANT
STATION PROTECTION PRESSURIZED (MISCELLANEOUS) IMPLANT
TUBING CIL FLEX 10 FLL-RA (TUBING) IMPLANT

## 2024-07-07 NOTE — Progress Notes (Signed)
 Upon arrival, pt. C/o seeing spots all over.  States I've had it before, but it's been a while. Dr. Florencio in at bedside, speaking with pt. And her spouse.

## 2024-07-08 ENCOUNTER — Encounter: Payer: Self-pay | Admitting: Internal Medicine

## 2024-07-10 ENCOUNTER — Encounter: Payer: Self-pay | Admitting: Internal Medicine

## 2024-07-14 DIAGNOSIS — I2510 Atherosclerotic heart disease of native coronary artery without angina pectoris: Secondary | ICD-10-CM | POA: Diagnosis not present

## 2024-07-14 DIAGNOSIS — I2583 Coronary atherosclerosis due to lipid rich plaque: Secondary | ICD-10-CM | POA: Diagnosis not present

## 2024-07-14 DIAGNOSIS — Z789 Other specified health status: Secondary | ICD-10-CM | POA: Diagnosis not present

## 2024-07-14 DIAGNOSIS — E1169 Type 2 diabetes mellitus with other specified complication: Secondary | ICD-10-CM | POA: Diagnosis not present

## 2024-07-14 DIAGNOSIS — D0512 Intraductal carcinoma in situ of left breast: Secondary | ICD-10-CM | POA: Diagnosis not present

## 2024-07-14 DIAGNOSIS — I10 Essential (primary) hypertension: Secondary | ICD-10-CM | POA: Diagnosis not present

## 2024-07-14 DIAGNOSIS — R0789 Other chest pain: Secondary | ICD-10-CM | POA: Diagnosis not present

## 2024-07-14 DIAGNOSIS — E782 Mixed hyperlipidemia: Secondary | ICD-10-CM | POA: Diagnosis not present

## 2024-07-14 NOTE — Patient Instructions (Addendum)
-   please schedule with Dr. Mcgarrah - Cardiac Stress MRI has been ordered - someone will call to get this schedule - usually done at [facility].  - Please see us  back in 3 months.

## 2024-07-16 ENCOUNTER — Encounter (HOSPITAL_COMMUNITY): Payer: Self-pay | Admitting: Internal Medicine

## 2024-07-17 ENCOUNTER — Encounter: Payer: Self-pay | Admitting: Internal Medicine

## 2024-07-21 ENCOUNTER — Other Ambulatory Visit (HOSPITAL_COMMUNITY): Payer: Self-pay | Admitting: Internal Medicine

## 2024-07-21 DIAGNOSIS — R0789 Other chest pain: Secondary | ICD-10-CM

## 2024-07-21 DIAGNOSIS — I2510 Atherosclerotic heart disease of native coronary artery without angina pectoris: Secondary | ICD-10-CM

## 2024-07-21 DIAGNOSIS — Z789 Other specified health status: Secondary | ICD-10-CM | POA: Diagnosis not present

## 2024-07-21 DIAGNOSIS — E1169 Type 2 diabetes mellitus with other specified complication: Secondary | ICD-10-CM | POA: Diagnosis not present

## 2024-07-21 DIAGNOSIS — I10 Essential (primary) hypertension: Secondary | ICD-10-CM | POA: Diagnosis not present

## 2024-07-21 DIAGNOSIS — E782 Mixed hyperlipidemia: Secondary | ICD-10-CM | POA: Diagnosis not present

## 2024-07-30 ENCOUNTER — Encounter (HOSPITAL_COMMUNITY): Payer: Self-pay

## 2024-07-30 ENCOUNTER — Other Ambulatory Visit (HOSPITAL_COMMUNITY): Payer: Self-pay | Admitting: *Deleted

## 2024-07-30 DIAGNOSIS — Z01810 Encounter for preprocedural cardiovascular examination: Secondary | ICD-10-CM

## 2024-07-31 ENCOUNTER — Telehealth (HOSPITAL_COMMUNITY): Payer: Self-pay | Admitting: *Deleted

## 2024-07-31 NOTE — Telephone Encounter (Signed)
 Attempted to call patient regarding upcoming cardiac MRI appointment. Left message on voicemail with name and callback number  Chantal Requena RN Navigator Cardiac Imaging [facility] [phone] Office [phone] Cell  Reminder to avoid caffeine 12 hours prior to her cardiac stress MRI.

## 2024-08-01 ENCOUNTER — Other Ambulatory Visit (HOSPITAL_COMMUNITY): Payer: Self-pay | Admitting: Internal Medicine

## 2024-08-01 ENCOUNTER — Ambulatory Visit (HOSPITAL_COMMUNITY)
Admission: RE | Admit: 2024-08-01 | Discharge: 2024-08-01 | Disposition: A | Discharge status: Home / Self Care | Source: Ambulatory Visit | Attending: Cardiology | Admitting: Cardiology

## 2024-08-01 ENCOUNTER — Ambulatory Visit (HOSPITAL_COMMUNITY)
Admission: RE | Admit: 2024-08-01 | Discharge: 2024-08-01 | Disposition: A | Discharge status: Home / Self Care | Source: Ambulatory Visit | Attending: Internal Medicine | Admitting: Internal Medicine

## 2024-08-01 DIAGNOSIS — I2583 Coronary atherosclerosis due to lipid rich plaque: Secondary | ICD-10-CM | POA: Diagnosis not present

## 2024-08-01 DIAGNOSIS — Z01810 Encounter for preprocedural cardiovascular examination: Secondary | ICD-10-CM | POA: Insufficient documentation

## 2024-08-01 DIAGNOSIS — I2510 Atherosclerotic heart disease of native coronary artery without angina pectoris: Secondary | ICD-10-CM | POA: Insufficient documentation

## 2024-08-01 DIAGNOSIS — R0789 Other chest pain: Secondary | ICD-10-CM

## 2024-08-01 MED ORDER — GADOBUTROL 1 MMOL/ML IV SOLN
10.0000 mL | Freq: Once | INTRAVENOUS | Status: AC | PRN
  Administered 2024-08-01: 10 mL via INTRAVENOUS

## 2024-08-01 MED ORDER — REGADENOSON 0.4 MG/5ML IV SOLN
0.4000 mg | Freq: Once | INTRAVENOUS | Status: AC
  Administered 2024-08-01: 0.4 mg via INTRAVENOUS
  Filled 2024-08-01: qty 5

## 2024-08-01 MED ORDER — ALBUTEROL SULFATE HFA 108 (90 BASE) MCG/ACT IN AERS
INHALATION_SPRAY | RESPIRATORY_TRACT | Status: AC
  Filled 2024-08-01: qty 6.7

## 2024-08-01 MED ORDER — REGADENOSON 0.4 MG/5ML IV SOLN
INTRAVENOUS | Status: AC
  Filled 2024-08-01: qty 5

## 2024-08-01 MED ORDER — AMINOPHYLLINE 25 MG/ML IV SOLN
INTRAVENOUS | Status: AC
  Filled 2024-08-01: qty 10

## 2024-08-01 MED ORDER — NITROGLYCERIN 0.4 MG SL SUBL
SUBLINGUAL_TABLET | SUBLINGUAL | Status: AC
  Filled 2024-08-01: qty 1

## 2024-08-01 MED ORDER — METOPROLOL TARTRATE 5 MG/5ML IV SOLN
INTRAVENOUS | Status: AC
  Filled 2024-08-01: qty 5

## 2024-08-01 NOTE — Progress Notes (Signed)
 Patient reported having some chest pressure and flushing. States that the symptoms are easing up.  Dr. Kate at the scanner and made aware.

## 2024-08-28 ENCOUNTER — Encounter: Payer: Self-pay | Admitting: Radiation Oncology

## 2024-08-28 ENCOUNTER — Ambulatory Visit
Admission: RE | Admit: 2024-08-28 | Discharge: 2024-08-28 | Disposition: A | Discharge status: Home / Self Care | Payer: Medicare HMO | Source: Ambulatory Visit | Attending: Radiation Oncology | Admitting: Radiation Oncology

## 2024-08-28 VITALS — BP 115/78 | HR 64 | Temp 98.2°F | Resp 16 | Wt 214.0 lb

## 2024-08-28 DIAGNOSIS — Z923 Personal history of irradiation: Secondary | ICD-10-CM | POA: Insufficient documentation

## 2024-08-28 DIAGNOSIS — I2510 Atherosclerotic heart disease of native coronary artery without angina pectoris: Secondary | ICD-10-CM | POA: Insufficient documentation

## 2024-08-28 DIAGNOSIS — Z888 Allergy status to other drugs, medicaments and biological substances status: Secondary | ICD-10-CM | POA: Insufficient documentation

## 2024-08-28 DIAGNOSIS — D0512 Intraductal carcinoma in situ of left breast: Secondary | ICD-10-CM | POA: Insufficient documentation

## 2024-08-28 DIAGNOSIS — Z170 Estrogen receptor positive status [ER+]: Secondary | ICD-10-CM | POA: Diagnosis not present

## 2024-08-28 NOTE — Progress Notes (Signed)
 Radiation Oncology Follow up Note  Name: Kristine Rose   Date:   08/28/2024 MRN:  [id number] DOB: 11/27/1955    This 68 y.o. female presents to the clinic today for 2-year follow-up status post whole breast radiation to her left breast for ER-positive ductal carcinoma in situ.  REFERRING PROVIDER: Cleotilde Oneil FALCON, MD  HPI: Patient is a 68 year old female now out over 2 years having completed whole breast radiation to her left breast for ER positive ductal carcinoma in situ.  Seen today in routine follow-up she is doing well specifically denies breast tenderness cough or bone pain.  She has had issues with some coronary atherosclerosis recently has seen a cardiologist.  For chest pain was noted to have moderate nonobstructive CAD.  She is allergic to most statins.  She is not on endocrine therapy.  She had a mammogram back in August which I have reviewed was BI-RADS 2 benign.  COMPLICATIONS OF TREATMENT: none  FOLLOW UP COMPLIANCE: keeps appointments   PHYSICAL EXAM:  BP 115/78   Pulse 64   Temp 98.2 F (36.8 C) (Tympanic)   Resp 16   Wt 214 lb (97.1 kg)   BMI 33.52 kg/m  Lungs are clear to A&P cardiac examination essentially unremarkable with regular rate and rhythm. No dominant mass or nodularity is noted in either breast in 2 positions examined. Incision is well-healed. No axillary or supraclavicular adenopathy is appreciated. Cosmetic result is excellent.  Well-developed well-nourished patient in NAD. HEENT reveals PERLA, EOMI, discs not visualized.  Oral cavity is clear. No oral mucosal lesions are identified. Neck is clear without evidence of cervical or supraclavicular adenopathy. Lungs are clear to A&P. Cardiac examination is essentially unremarkable with regular rate and rhythm without murmur rub or thrill. Abdomen is benign with no organomegaly or masses noted. Motor sensory and DTR levels are equal and symmetric in the upper and lower extremities. Cranial nerves II through  XII are grossly intact. Proprioception is intact. No peripheral adenopathy or edema is identified. No motor or sensory levels are noted. Crude visual fields are within normal range.  RADIOLOGY RESULTS: Mammograms reviewed compatible with above-stated findings  PLAN: Present time patient is now out over 2 years from whole breast radiation to her left breast.  And pleased with her overall progress.  I am going to turn follow-up care over to medical oncology.  We have discussed the possibility of radiation attributing to some of her coronary artery disease.  The latency.  Between radiation and progressive coronary artery disease is at least 5 to 10 years.  Some fortunate she is not able to take statins.  At this time patient knows to call with any concerns at any time.  I would like to take this opportunity to thank you for allowing me to participate in the care of your patient.SABRA Marcey Penton, MD

## 2024-10-09 ENCOUNTER — Inpatient Hospital Stay: Attending: Radiation Oncology

## 2024-10-09 NOTE — Progress Notes (Signed)
 [facility] CSW Progress Note  Visual Merchandiser received a request to follow-up on advance directives questions.  Patient and her husband are interested in completing their advance directives.    Interventions: Provided education and assistance to client regarding Advanced Directives. Per her request, gave Raoul Moats two copies to obtain to review.      Follow Up Plan:  Patient will contact CSW with any support or resource needs    Macario CHRISTELLA Au, LCSW Clinical Social Worker [facility]

## 2024-12-19 ENCOUNTER — Ambulatory Visit: Payer: HMO | Admitting: Oncology
